# Patient Record
Sex: Male | Born: 1960 | Race: White | Hispanic: No | Marital: Married | Smoking: Never smoker
Health system: Southern US, Community
[De-identification: ages and names within clinical notes are randomized; demographics above are authoritative.]

## PROBLEM LIST (undated history)

## (undated) DIAGNOSIS — Z8249 Family history of ischemic heart disease and other diseases of the circulatory system: Secondary | ICD-10-CM

## (undated) DIAGNOSIS — I2510 Atherosclerotic heart disease of native coronary artery without angina pectoris: Secondary | ICD-10-CM

## (undated) DIAGNOSIS — R001 Bradycardia, unspecified: Secondary | ICD-10-CM

## (undated) DIAGNOSIS — E785 Hyperlipidemia, unspecified: Secondary | ICD-10-CM

## (undated) DIAGNOSIS — I10 Essential (primary) hypertension: Secondary | ICD-10-CM

## (undated) HISTORY — DX: Essential (primary) hypertension: I10

## (undated) HISTORY — DX: Family history of ischemic heart disease and other diseases of the circulatory system: Z82.49

## (undated) HISTORY — DX: Atherosclerotic heart disease of native coronary artery without angina pectoris: I25.10

---

## 2014-05-06 ENCOUNTER — Emergency Department (HOSPITAL_COMMUNITY)
Admission: EM | Admit: 2014-05-06 | Discharge: 2014-05-06 | Disposition: A | Discharge status: Home / Self Care | Payer: BC Managed Care – PPO | Attending: Emergency Medicine | Admitting: Emergency Medicine

## 2014-05-06 ENCOUNTER — Emergency Department (HOSPITAL_COMMUNITY): Payer: BC Managed Care – PPO

## 2014-05-06 ENCOUNTER — Encounter (HOSPITAL_COMMUNITY): Payer: Self-pay | Admitting: Emergency Medicine

## 2014-05-06 DIAGNOSIS — I10 Essential (primary) hypertension: Secondary | ICD-10-CM | POA: Insufficient documentation

## 2014-05-06 DIAGNOSIS — R0789 Other chest pain: Secondary | ICD-10-CM | POA: Insufficient documentation

## 2014-05-06 DIAGNOSIS — M25519 Pain in unspecified shoulder: Secondary | ICD-10-CM | POA: Insufficient documentation

## 2014-05-06 DIAGNOSIS — R51 Headache: Secondary | ICD-10-CM | POA: Insufficient documentation

## 2014-05-06 LAB — CBC
HCT: 44.2 % (ref 39.0–52.0)
HEMOGLOBIN: 15.6 g/dL (ref 13.0–17.0)
MCH: 30.2 pg (ref 26.0–34.0)
MCHC: 35.3 g/dL (ref 30.0–36.0)
MCV: 85.7 fL (ref 78.0–100.0)
Platelets: 217 10*3/uL (ref 150–400)
RBC: 5.16 MIL/uL (ref 4.22–5.81)
RDW: 12.9 % (ref 11.5–15.5)
WBC: 9.8 10*3/uL (ref 4.0–10.5)

## 2014-05-06 LAB — URINALYSIS, ROUTINE W REFLEX MICROSCOPIC
BILIRUBIN URINE: NEGATIVE
Glucose, UA: NEGATIVE mg/dL
HGB URINE DIPSTICK: NEGATIVE
Ketones, ur: NEGATIVE mg/dL
Leukocytes, UA: NEGATIVE
Nitrite: NEGATIVE
Protein, ur: NEGATIVE mg/dL
SPECIFIC GRAVITY, URINE: 1.007 (ref 1.005–1.030)
UROBILINOGEN UA: 0.2 mg/dL (ref 0.0–1.0)
pH: 7.5 (ref 5.0–8.0)

## 2014-05-06 LAB — COMPREHENSIVE METABOLIC PANEL
ALK PHOS: 84 U/L (ref 39–117)
ALT: 24 U/L (ref 0–53)
AST: 19 U/L (ref 0–37)
Albumin: 3.9 g/dL (ref 3.5–5.2)
BILIRUBIN TOTAL: 0.5 mg/dL (ref 0.3–1.2)
BUN: 16 mg/dL (ref 6–23)
CHLORIDE: 102 meq/L (ref 96–112)
CO2: 23 mEq/L (ref 19–32)
Calcium: 9.3 mg/dL (ref 8.4–10.5)
Creatinine, Ser: 0.89 mg/dL (ref 0.50–1.35)
GFR calc non Af Amer: >90 mL/min (ref >90)
GLUCOSE: 100 mg/dL — AB (ref 70–99)
Potassium: 3.8 mEq/L (ref 3.7–5.3)
Sodium: 139 mEq/L (ref 137–147)
TOTAL PROTEIN: 7.2 g/dL (ref 6.0–8.3)

## 2014-05-06 LAB — I-STAT TROPONIN, ED
TROPONIN I, POC: 0.07 ng/mL (ref 0.00–0.08)
Troponin i, poc: 0.07 ng/mL (ref 0.00–0.08)

## 2014-05-06 MED ORDER — HYDROCHLOROTHIAZIDE 25 MG PO TABS: 25.0000 mg | ORAL_TABLET | Freq: Every day | ORAL | Status: DC

## 2014-05-06 MED ORDER — ASPIRIN 81 MG PO CHEW: 324.0000 mg | CHEWABLE_TABLET | Freq: Once | ORAL | Status: DC

## 2014-05-06 MED ORDER — LORAZEPAM 2 MG/ML IJ SOLN
1.0000 mg | Freq: Once | INTRAMUSCULAR | Status: AC
  Administered 2014-05-06: 1 mg via INTRAVENOUS
  Filled 2014-05-06: qty 1

## 2014-05-06 MED ORDER — NITROGLYCERIN 0.4 MG SL SUBL: 0.4000 mg | SUBLINGUAL_TABLET | SUBLINGUAL | Status: DC | PRN

## 2014-05-06 NOTE — ED Notes (Signed)
PA at bedside.

## 2014-05-06 NOTE — ED Notes (Signed)
Father and Mother both had MI.  Pt does have heartburn frequently.

## 2014-05-06 NOTE — ED Provider Notes (Signed)
CSN: [id number]     Arrival date & time 05/06/14  1045 History   First MD Initiated Contact with Patient 05/06/14 1046     Chief Complaint  Patient presents with  . Chest Pain     (Consider location/radiation/quality/duration/timing/severity/associated sxs/prior Treatment) The history is provided by the patient and a relative. No language interpreter was used.   Tyler Holder is a(n) 53 y.o. male who presents emergency Department with chief complaint of chest pain. He was brought in via EMS from outside urgent care. He has no significant past medical history. The patient states that for the past 4 days he has had intermittent left sided chest pain described as sharp and radiating to the left shoulder. He describes his left shoulder pain and aching. He states that it comes on at any time whether he is exerting himself resting. He states but he has no associated shortness of breath, diaphoresis, nausea, vomiting. He denies any lifting or tearing pain. He denies any radiation to his back. The patient last episode occurred this morning at 8 AM. His wife made him come to evaluation. He does not see doctors regularly and was found to have a hypertensive 190/120. He denies a history of high cholesterol, smoking, or diabetes. He denies a known history of hypertension. His mother and father both had heart attacks in their 10760s. Patient's wife states that he is under extreme stress. 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 Patient also complains of headache which begins in the left occiput and radiates to his left eye. His wife states is relieved with massage. Denies photophobia, phonophobia, UL throbbing, N/V, visual changes, stiff neck, neck pain, rash, or "thunderclap" onset.  Denies fevers, chills, myalgias, arthralgias. Denies DOE, SOB, chest tightness or pressure. Denies dysuria, flank pain, suprapubic pain, frequency, urgency, or hematuria.Denies abdominal pain,  nausea, vomiting, diarrhea or constipation.    History reviewed. No pertinent past medical history. History reviewed. No pertinent past surgical history. No family history on file. History  Substance Use Topics  . Smoking status: Never Smoker   . Smokeless tobacco: Not on file  . Alcohol Use: No    Review of Systems  Ten systems reviewed and are negative for acute change, except as noted in the HPI. \   Allergies  Review of patient's allergies indicates no known allergies.  Home Medications   Prior to Admission medications   Not on File   BP 143/100  Pulse 83  Temp(Src) 98.2 F (36.8 C) (Oral)  Resp 20  Ht 5\' 10"  (1.778 m)  Wt 193 lb (87.544 kg)  BMI 27.69 kg/m2  SpO2 100% Physical Exam  Nursing note and vitals reviewed. Constitutional: He is oriented to person, place, and time. He appears well-developed and well-nourished. No distress.  HENT:  Head: Normocephalic and atraumatic.  Eyes: Conjunctivae are normal. No scleral icterus.  Neck: Normal range of motion. Neck supple.  Cardiovascular: Normal rate, regular rhythm and normal heart sounds.   Pulmonary/Chest: Effort normal and breath sounds normal. No respiratory distress.  Abdominal: Soft. There is no tenderness.  Musculoskeletal: Normal range of motion. He exhibits no edema.  Neurological: He is alert and oriented to person, place, and time.  Skin: Skin is warm and dry. He is not diaphoretic.  Psychiatric: His behavior is normal.  Very anxious     ED Course  Procedures (including critical care time) Labs Review Labs Reviewed  CBC  URINALYSIS,  ROUTINE W REFLEX MICROSCOPIC  COMPREHENSIVE METABOLIC PANEL  I-STAT TROPOININ, ED    Imaging Review No results found.   EKG Interpretation   Date/Time:  Tuesday May 06 2014 10:52:11 EDT Ventricular Rate:  70 PR Interval:  143 QRS Duration: 96 QT Interval:  399 QTC Calculation: 430 R Axis:   -29 Text Interpretation:  Sinus rhythm Left axis deviation  Non-specific ST-t  changes Confirmed by Juleen ChinaKOHUT  MD, STEPHEN ([id number]) on 05/06/2014 11:34:31 AM      MDM   Final diagnoses:  None                       3:26 PM Filed Vitals:   05/06/14 1215 05/06/14 1346 05/06/14 1455 05/06/14 1457  BP: 161/94 152/95 150/76 154/100  Pulse: 80 70    Temp:      TempSrc:      Resp: 14 18    Height:      Weight:      SpO2: 97% 96%      Vital Signs - BP: 150/76 mmHg ; BP Location: Right arm ; BP Method: Automatic ; Patient Position (if appropriate): Sitting 14:57:41 Vital Signs DH Vital Signs - BP: 154/100 mmHg ; BP Location: Left arm ; BP Method: Automatic ; Patient Position (if appropriate): Sitting  3:38 PM BP 154/100  Pulse 70  Temp(Src) 98.2 F (36.8 C) (Oral)  Resp 18  Ht 5\' 10"  (1.778 m)  Wt 193 lb (87.544 kg)  BMI 27.69 kg/m2  SpO2 96% Patient with htn, CP Free at this time  Equal pulses and pressures BL. No repeat episodes of cp here in the ED.[name]. Discussed the need for PCP follow up for the patient's HTN. Patient is to be discharged with recommendation to follow up with PCP in regards to today's hospital visit. Chest pain is not likely of cardiac or pulmonary etiology d/t presentation, perc negative, VSS, no tracheal deviation, no JVD or new murmur, RRR, breath sounds equal bilaterally, EKG without acute abnormalities, negative troponin, and negative CXR. Pt has been advised to return to the ED is CP becomes exertional, associated with diaphoresis or nausea, radiates to left jaw/arm, worsens or becomes concerning in any way. Pt appears reliable for follow up and is agreeable to discharge.   Case has been discussed with and seen by Dr. Juleen ChinaKohut who agrees with the above plan to discharge.       Arthor CaptainAbigail Armanda Forand, PA-C 05/06/14 2213

## 2014-05-06 NOTE — Discharge Instructions (Signed)
Your caregiver has diagnosed you as having chest pain that is not specific for one problem, but does not require admission.  You are at low risk for an acute heart condition or other serious illness. Chest pain comes from many different causes.  SEEK IMMEDIATE MEDICAL ATTENTION IF: You have severe chest pain, especially if the pain is crushing or pressure-like and spreads to the arms, back, neck, or jaw, or if you have sweating, nausea (feeling sick to your stomach), or shortness of breath. THIS IS AN EMERGENCY. Don't wait to see if the pain will go away. Get medical help at once. Call 911 or 0 (operator). DO NOT drive yourself to the hospital.  Your chest pain gets worse and does not go away with rest.  You have an attack of chest pain lasting longer than usual, despite rest and treatment with the medications your caregiver has prescribed.  You wake from sleep with chest pain or shortness of breath.  You feel dizzy or faint.  You have chest pain not typical of your usual pain for which you originally saw your caregiver.   Chest Pain (Nonspecific) It is often hard to give a specific diagnosis for the cause of chest pain. There is always a chance that your pain could be related to something serious, such as a heart attack or a blood clot in the lungs. You need to follow up with your caregiver for further evaluation. CAUSES   Heartburn.  Pneumonia or bronchitis.  Anxiety or stress.  Inflammation around your heart (pericarditis) or lung (pleuritis or pleurisy).  A blood clot in the lung.  A collapsed lung (pneumothorax). It can develop suddenly on its own (spontaneous pneumothorax) or from injury (trauma) to the chest.  Shingles infection (herpes zoster virus). The chest wall is composed of bones, muscles, and cartilage. Any of these can be the source of the pain.  The bones can be bruised by injury.  The muscles or cartilage can be strained by coughing or overwork.  The cartilage can  be affected by inflammation and become sore (costochondritis). DIAGNOSIS  Lab tests or other studies, such as X-rays, electrocardiography, stress testing, or cardiac imaging, may be needed to find the cause of your pain.  TREATMENT   Treatment depends on what may be causing your chest pain. Treatment may include:  Acid blockers for heartburn.  Anti-inflammatory medicine.  Pain medicine for inflammatory conditions.  Antibiotics if an infection is present.  You may be advised to change lifestyle habits. This includes stopping smoking and avoiding alcohol, caffeine, and chocolate.  You may be advised to keep your head raised (elevated) when sleeping. This reduces the chance of acid going backward from your stomach into your esophagus.  Most of the time, nonspecific chest pain will improve within 2 to 3 days with rest and mild pain medicine. HOME CARE INSTRUCTIONS   If antibiotics were prescribed, take your antibiotics as directed. Finish them even if you start to feel better.  For the next few days, avoid physical activities that bring on chest pain. Continue physical activities as directed.  Do not smoke.  Avoid drinking alcohol.  Only take over-the-counter or prescription medicine for pain, discomfort, or fever as directed by your caregiver.  Follow your caregiver's suggestions for further testing if your chest pain does not go away.  Keep any follow-up appointments you made. If you do not go to an appointment, you could develop lasting (chronic) problems with pain. If there is any problem keeping an  appointment, you must call to reschedule. SEEK MEDICAL CARE IF:   You think you are having problems from the medicine you are taking. Read your medicine instructions carefully.  Your chest pain does not go away, even after treatment.  You develop a rash with blisters on your chest. SEEK IMMEDIATE MEDICAL CARE IF:   You have increased chest pain or pain that spreads to your arm,  neck, jaw, back, or abdomen.  You develop shortness of breath, an increasing cough, or you are coughing up blood.  You have severe back or abdominal pain, feel nauseous, or vomit.  You develop severe weakness, fainting, or chills.  You have a fever. THIS IS AN EMERGENCY. Do not wait to see if the pain will go away. Get medical help at once. Call your local emergency services (911 in [place] Do not drive yourself to the hospital. MAKE SURE YOU:   Understand these instructions.  Will watch your condition.  Will get help right away if you are not doing well or get worse. Document Released: 09/14/2005 Document Revised: 02/27/2012 Document Reviewed: 07/10/2008 [facility] Patient Information 2014 [place], [place].  Hypertension As your heart beats, it forces blood through your arteries. This force is your blood pressure. If the pressure is too high, it is called hypertension (HTN) or high blood pressure. HTN is dangerous because you may have it and not know it. High blood pressure may mean that your heart has to work harder to pump blood. Your arteries may be narrow or stiff. The extra work puts you at risk for heart disease, stroke, and other problems.  Blood pressure consists of two numbers, a higher number over a lower, 110/72, for example. It is stated as "110 over 72." The ideal is below 120 for the top number (systolic) and under 80 for the bottom (diastolic). Write down your blood pressure today. You should pay close attention to your blood pressure if you have certain conditions such as:  Heart failure.  Prior heart attack.  Diabetes  Chronic kidney disease.  Prior stroke.  Multiple risk factors for heart disease. To see if you have HTN, your blood pressure should be measured while you are seated with your arm held at the level of the heart. It should be measured at least twice. A one-time elevated blood pressure reading (especially in the Emergency Department) does not mean that  you need treatment. There may be conditions in which the blood pressure is different between your right and left arms. It is important to see your caregiver soon for a recheck. Most people have essential hypertension which means that there is not a specific cause. This type of high blood pressure may be lowered by changing lifestyle factors such as:  Stress.  Smoking.  Lack of exercise.  Excessive weight.  Drug/tobacco/alcohol use.  Eating less salt. Most people do not have symptoms from high blood pressure until it has caused damage to the body. Effective treatment can often prevent, delay or reduce that damage. TREATMENT  When a cause has been identified, treatment for high blood pressure is directed at the cause. There are a large number of medications to treat HTN. These fall into several categories, and your caregiver will help you select the medicines that are best for you. Medications may have side effects. You should review side effects with your caregiver. If your blood pressure stays high after you have made lifestyle changes or started on medicines,   Your medication(s) may need to be changed.  Other problems may need to be addressed.  Be certain you understand your prescriptions, and know how and when to take your medicine.  Be sure to follow up with your caregiver within the time frame advised (usually within two weeks) to have your blood pressure rechecked and to review your medications.  If you are taking more than one medicine to lower your blood pressure, make sure you know how and at what times they should be taken. Taking two medicines at the same time can result in blood pressure that is too low. SEEK IMMEDIATE MEDICAL CARE IF:  You develop a severe headache, blurred or changing vision, or confusion.  You have unusual weakness or numbness, or a faint feeling.  You have severe chest or abdominal pain, vomiting, or breathing problems. MAKE SURE YOU:   Understand  these instructions.  Will watch your condition.  Will get help right away if you are not doing well or get worse. Document Released: 12/05/2005 Document Revised: 02/27/2012 Document Reviewed: 07/25/2008 [facility] Patient Information 2014 [place].    Hydrochlorothiazide, HCTZ capsules or tablets What is this medicine? HYDROCHLOROTHIAZIDE (hye droe klor oh THYE a zide) is a diuretic. It increases the amount of urine passed, which causes the body to lose salt and water. This medicine is used to treat high blood pressure. It is also reduces the swelling and water retention caused by various medical conditions, such as heart, liver, or kidney disease. This medicine may be used for other purposes; ask your health care provider or pharmacist if you have questions. COMMON BRAND NAME(S): Esidrix, Ezide, HydroDIURIL, Microzide, Oretic, Zide What should I tell my health care provider before I take this medicine? They need to know if you have any of these conditions: -diabetes -gout -immune system problems, like lupus -kidney disease or kidney stones -liver disease -pancreatitis -small amount of urine or difficulty passing urine -an unusual or allergic reaction to hydrochlorothiazide, sulfa drugs, other medicines, foods, dyes, or preservatives -pregnant or trying to get pregnant -breast-feeding How should I use this medicine? Take this medicine by mouth with a glass of water. Follow the directions on the prescription label. Take your medicine at regular intervals. Remember that you will need to pass urine frequently after taking this medicine. Do not take your doses at a time of day that will cause you problems. Do not stop taking your medicine unless your doctor tells you to. Talk to your pediatrician regarding the use of this medicine in children. Special care may be needed. Overdosage: If you think you have taken too much of this medicine contact a poison control center or emergency room  at once. NOTE: This medicine is only for you. Do not share this medicine with others. What if I miss a dose? If you miss a dose, take it as soon as you can. If it is almost time for your next dose, take only that dose. Do not take double or extra doses. What may interact with this medicine? -cholestyramine -colestipol -digoxin -dofetilide -lithium -medicines for blood pressure -medicines for diabetes -medicines that relax muscles for surgery -other diuretics -steroid medicines like prednisone or cortisone This list may not describe all possible interactions. Give your health care provider a list of all the medicines, herbs, non-prescription drugs, or dietary supplements you use. Also tell them if you smoke, drink alcohol, or use illegal drugs. Some items may interact with your medicine. What should I watch for while using this medicine? Visit your doctor or health care professional  for regular checks on your progress. Check your blood pressure as directed. Ask your doctor or health care professional what your blood pressure should be and when you should contact him or her. You may need to be on a special diet while taking this medicine. Ask your doctor. Check with your doctor or health care professional if you get an attack of severe diarrhea, nausea and vomiting, or if you sweat a lot. The loss of too much body fluid can make it dangerous for you to take this medicine. You may get drowsy or dizzy. Do not drive, use machinery, or do anything that needs mental alertness until you know how this medicine affects you. Do not stand or sit up quickly, especially if you are an older patient. This reduces the risk of dizzy or fainting spells. Alcohol may interfere with the effect of this medicine. Avoid alcoholic drinks. This medicine may affect your blood sugar level. If you have diabetes, check with your doctor or health care professional before changing the dose of your diabetic medicine. This  medicine can make you more sensitive to the sun. Keep out of the sun. If you cannot avoid being in the sun, wear protective clothing and use sunscreen. Do not use sun lamps or tanning beds/booths. What side effects may I notice from receiving this medicine? Side effects that you should report to your doctor or health care professional as soon as possible: -allergic reactions such as skin rash or itching, hives, swelling of the lips, mouth, tongue, or throat -changes in vision -chest pain -eye pain -fast or irregular heartbeat -feeling faint or lightheaded, falls -gout attack -muscle pain or cramps -pain or difficulty when passing urine -pain, tingling, numbness in the hands or feet -redness, blistering, peeling or loosening of the skin, including inside the mouth -unusually weak or tired Side effects that usually do not require medical attention (report to your doctor or health care professional if they continue or are bothersome): -change in sex drive or performance -dry mouth -headache -stomach upset This list may not describe all possible side effects. Call your doctor for medical advice about side effects. You may report side effects to FDA at 1-800-FDA-1088. Where should I keep my medicine? Keep out of the reach of children. Store at room temperature between 15 and 30 degrees C (59 and 86 degrees F). Do not freeze. Protect from light and moisture. Keep container closed tightly. Throw away any unused medicine after the expiration date. NOTE: This sheet is a summary. It may not cover all possible information. If you have questions about this medicine, talk to your doctor, pharmacist, or health care provider.  2014, [employer]vier/[employer] Standard. (2010-07-30 12:57:37)

## 2014-05-06 NOTE — ED Notes (Signed)
PT arrived via [facility] from MD office.  Pt is here with chest tightness intermittent that started on Thursday.  It lasts only about one minute and is 10/10 when comes.  Pt reports mid sternal chest pain, left eye headache and neck pain.  Bp 190/120 to 174/103.  Pt has had 325mg  asa at MD office, no nitro.  Pt is currently pain free

## 2014-05-10 NOTE — ED Provider Notes (Signed)
Medical screening examination/treatment/procedure(s) were performed by non-physician practitioner and as supervising physician I was immediately available for consultation/collaboration.   EKG Interpretation   Date/Time:  Tuesday May 06 2014 10:52:11 EDT Ventricular Rate:  70 PR Interval:  143 QRS Duration: 96 QT Interval:  399 QTC Calculation: 430 R Axis:   -29 Text Interpretation:  Sinus rhythm Left axis deviation Non-specific ST-t  changes Confirmed by Juleen China  MD, Nate Common ([id number]) on 05/06/2014 11:34:31 AM       Raeford Razor, MD 05/10/14 1428

## 2014-05-29 ENCOUNTER — Ambulatory Visit (INDEPENDENT_AMBULATORY_CARE_PROVIDER_SITE_OTHER): Payer: BC Managed Care – PPO | Admitting: Cardiovascular Disease

## 2014-05-29 ENCOUNTER — Encounter: Payer: Self-pay | Admitting: Cardiovascular Disease

## 2014-05-29 VITALS — BP 131/87 | HR 63 | Ht 70.0 in | Wt 195.0 lb

## 2014-05-29 DIAGNOSIS — Z8249 Family history of ischemic heart disease and other diseases of the circulatory system: Secondary | ICD-10-CM

## 2014-05-29 DIAGNOSIS — R079 Chest pain, unspecified: Secondary | ICD-10-CM

## 2014-05-29 DIAGNOSIS — I10 Essential (primary) hypertension: Secondary | ICD-10-CM | POA: Insufficient documentation

## 2014-05-29 DIAGNOSIS — Z79899 Other long term (current) drug therapy: Secondary | ICD-10-CM

## 2014-05-29 MED ORDER — PANTOPRAZOLE SODIUM 40 MG PO TBEC: 40.0000 mg | DELAYED_RELEASE_TABLET | Freq: Every day | ORAL | Status: DC

## 2014-05-29 MED ORDER — AMLODIPINE BESYLATE 5 MG PO TABS: 5.0000 mg | ORAL_TABLET | Freq: Every day | ORAL | Status: DC

## 2014-05-29 NOTE — Assessment & Plan Note (Signed)
History chest pain dating back 3 weeks. It waxes and wanes, raised his left upper extremity as well as his throat. It has awakened from sleep. His only risk factors are hypertension and family history. I'm going to add amlodipine to his current medical regimen and obtain an exercise Myoview stress test. A total EKG performed several weeks ago at an urgent care was normal

## 2014-05-29 NOTE — Progress Notes (Signed)
     05/29/2014 Tyler Holder   September 24, 1961  [id number]  Primary Physician Tyler Rubenstein, MD Primary Cardiologist: Tyler Gess MD Tyler Holder   HPI:  Tyler Holder is a 53 year old appearing married Caucasian male father of 2 children, grandfather and one grandchild who does HVAC for a living. He is referred by his primary care physician for evaluation of chest pain. This pain is new onset and began 3 weeks ago. It waxes and wanes. It radiates to his throat and left upper extremity. His receptors include hypertension and family history.   Current Outpatient Prescriptions  Medication Sig Dispense Refill  . ALPRAZolam (XANAX) 0.25 MG tablet Take 0.25 mg by mouth as needed.       [name] aspirin 81 MG tablet Take 81 mg by mouth daily.      [name] ibuprofen (ADVIL,MOTRIN) 200 MG tablet Take 600 mg by mouth every morning.      [name] lisinopril (PRINIVIL,ZESTRIL) 20 MG tablet Take 20 mg by mouth daily.       [name] amLODipine (NORVASC) 5 MG tablet Take 1 tablet (5 mg total) by mouth daily.  180 tablet  3  . pantoprazole (PROTONIX) 40 MG tablet Take 1 tablet (40 mg total) by mouth daily.  30 tablet  11   No current facility-administered medications for this visit.    No Known Allergies  History   Social History  . Marital Status: Married    Spouse Name: N/A    Number of Children: N/A  . Years of Education: N/A   Occupational History  . Not on file.   Social History Main Topics  . Smoking status: Never Smoker   . Smokeless tobacco: Not on file  . Alcohol Use: No  . Drug Use: No  . Sexual Activity: Not on file   Other Topics Concern  . Not on file   Social History Narrative  . No narrative on file     Review of Systems: General: negative for chills, fever, night sweats or weight changes.  Cardiovascular: negative for chest pain, dyspnea on exertion, edema, orthopnea, palpitations, paroxysmal nocturnal dyspnea or shortness of breath Dermatological: negative for rash Respiratory: negative  for cough or wheezing Urologic: negative for hematuria Abdominal: negative for nausea, vomiting, diarrhea, bright red blood per rectum, melena, or hematemesis Neurologic: negative for visual changes, syncope, or dizziness All other systems reviewed and are otherwise negative except as noted above.    Blood pressure 131/87, pulse 63, height 5\' 10"  (1.778 m), weight 195 lb (88.451 kg).  General appearance: alert and no distress Neck: no adenopathy, no carotid bruit, no JVD, supple, symmetrical, trachea midline and thyroid not enlarged, symmetric, no tenderness/mass/nodules Lungs: clear to auscultation bilaterally Heart: regular rate and rhythm, S1, S2 normal, no murmur, click, rub or gallop Extremities: extremities normal, atraumatic, no cyanosis or edema  EKG not performed today  ASSESSMENT AND PLAN:   Essential hypertension On lisinopril, will add low-dose amlodipine for hypertensive effect as well as anti-anginal  Chest pain History chest pain dating back 3 weeks. It waxes and wanes, raised his left upper extremity as well as his throat. It has awakened from sleep. His only risk factors are hypertension and family history. I'm going to add amlodipine to his current medical regimen and obtain an exercise Myoview stress test. A total EKG performed several weeks ago at an urgent care was normal      Tyler Gess MD [facility], [facility] 05/29/2014 6:09 PM

## 2014-05-29 NOTE — Assessment & Plan Note (Signed)
On lisinopril, will add low-dose amlodipine for hypertensive effect as well as anti-anginal

## 2014-05-29 NOTE — Patient Instructions (Signed)
  We will see you back in follow up after the tests.  Dr Allyson Sabal has ordered : 1. Exercise Myoview- this is a test that looks at the blood flow to your heart muscle.  It takes approximately 2 1/2 hours. Please follow instruction sheet, as given.  2. Start Protonix 40mg  daily  3. Start Amlodipine 5mg  daily  4. Your physician recommends that you return for a FASTING lipid profile: see attached order form

## 2014-06-03 ENCOUNTER — Telehealth (HOSPITAL_COMMUNITY): Payer: Self-pay

## 2014-06-05 ENCOUNTER — Ambulatory Visit (INDEPENDENT_AMBULATORY_CARE_PROVIDER_SITE_OTHER): Payer: BC Managed Care – PPO | Admitting: Cardiology

## 2014-06-05 ENCOUNTER — Encounter (HOSPITAL_COMMUNITY): Payer: Self-pay | Admitting: *Deleted

## 2014-06-05 ENCOUNTER — Ambulatory Visit (HOSPITAL_BASED_OUTPATIENT_CLINIC_OR_DEPARTMENT_OTHER)
Admission: RE | Admit: 2014-06-05 | Discharge: 2014-06-05 | Disposition: A | Discharge status: Home / Self Care | Payer: BC Managed Care – PPO | Source: Ambulatory Visit | Attending: Cardiovascular Disease | Admitting: Cardiovascular Disease

## 2014-06-05 ENCOUNTER — Other Ambulatory Visit (HOSPITAL_COMMUNITY): Payer: Self-pay | Admitting: *Deleted

## 2014-06-05 ENCOUNTER — Encounter: Payer: Self-pay | Admitting: Cardiology

## 2014-06-05 ENCOUNTER — Inpatient Hospital Stay (HOSPITAL_COMMUNITY)
Admission: AD | Admit: 2014-06-05 | Discharge: 2014-06-07 | DRG: 247 | Disposition: A | Discharge status: Home / Self Care | Payer: BC Managed Care – PPO | Source: Ambulatory Visit | Attending: Cardiovascular Disease | Admitting: Cardiovascular Disease

## 2014-06-05 VITALS — BP 166/98 | HR 75 | Ht 70.0 in | Wt 195.0 lb

## 2014-06-05 DIAGNOSIS — Z7982 Long term (current) use of aspirin: Secondary | ICD-10-CM

## 2014-06-05 DIAGNOSIS — R079 Chest pain, unspecified: Secondary | ICD-10-CM | POA: Diagnosis not present

## 2014-06-05 DIAGNOSIS — E785 Hyperlipidemia, unspecified: Secondary | ICD-10-CM | POA: Diagnosis present

## 2014-06-05 DIAGNOSIS — R9430 Abnormal result of cardiovascular function study, unspecified: Secondary | ICD-10-CM

## 2014-06-05 DIAGNOSIS — Z8249 Family history of ischemic heart disease and other diseases of the circulatory system: Secondary | ICD-10-CM

## 2014-06-05 DIAGNOSIS — I2582 Chronic total occlusion of coronary artery: Secondary | ICD-10-CM | POA: Diagnosis present

## 2014-06-05 DIAGNOSIS — I200 Unstable angina: Secondary | ICD-10-CM | POA: Diagnosis present

## 2014-06-05 DIAGNOSIS — I2510 Atherosclerotic heart disease of native coronary artery without angina pectoris: Secondary | ICD-10-CM | POA: Diagnosis present

## 2014-06-05 DIAGNOSIS — I10 Essential (primary) hypertension: Secondary | ICD-10-CM | POA: Diagnosis present

## 2014-06-05 DIAGNOSIS — I209 Angina pectoris, unspecified: Secondary | ICD-10-CM

## 2014-06-05 DIAGNOSIS — R9439 Abnormal result of other cardiovascular function study: Secondary | ICD-10-CM | POA: Diagnosis present

## 2014-06-05 HISTORY — DX: Hyperlipidemia, unspecified: E78.5

## 2014-06-05 LAB — CBC WITH DIFFERENTIAL/PLATELET
Basophils Absolute: 0 10*3/uL (ref 0.0–0.1)
Basophils Relative: 0 % (ref 0–1)
Eosinophils Absolute: 0.2 10*3/uL (ref 0.0–0.7)
Eosinophils Relative: 2 % (ref 0–5)
HCT: 45 % (ref 39.0–52.0)
Hemoglobin: 15.2 g/dL (ref 13.0–17.0)
Lymphocytes Relative: 27 % (ref 12–46)
Lymphs Abs: 2.7 10*3/uL (ref 0.7–4.0)
MCH: 29.3 pg (ref 26.0–34.0)
MCHC: 33.8 g/dL (ref 30.0–36.0)
MCV: 86.9 fL (ref 78.0–100.0)
Monocytes Absolute: 0.8 10*3/uL (ref 0.1–1.0)
Monocytes Relative: 8 % (ref 3–12)
Neutro Abs: 6.2 10*3/uL (ref 1.7–7.7)
Neutrophils Relative %: 63 % (ref 43–77)
Platelets: 222 10*3/uL (ref 150–400)
RBC: 5.18 MIL/uL (ref 4.22–5.81)
RDW: 12.8 % (ref 11.5–15.5)
WBC: 9.8 10*3/uL (ref 4.0–10.5)

## 2014-06-05 LAB — APTT: aPTT: 32 seconds (ref 24–37)

## 2014-06-05 LAB — MRSA PCR SCREENING: MRSA BY PCR: NEGATIVE

## 2014-06-05 LAB — PROTIME-INR
INR: 0.92 (ref 0.00–1.49)
Prothrombin Time: 12.2 seconds (ref 11.6–15.2)

## 2014-06-05 LAB — COMPREHENSIVE METABOLIC PANEL
ALT: 27 U/L (ref 0–53)
AST: 18 U/L (ref 0–37)
Albumin: 4.1 g/dL (ref 3.5–5.2)
Alkaline Phosphatase: 74 U/L (ref 39–117)
BUN: 20 mg/dL (ref 6–23)
CO2: 25 mEq/L (ref 19–32)
Calcium: 9.6 mg/dL (ref 8.4–10.5)
Chloride: 101 mEq/L (ref 96–112)
Creatinine, Ser: 1.03 mg/dL (ref 0.50–1.35)
GFR calc Af Amer: >90 mL/min (ref >90)
GFR calc non Af Amer: 81 mL/min — ABNORMAL LOW (ref >90)
Glucose, Bld: 94 mg/dL (ref 70–99)
Potassium: 4.1 mEq/L (ref 3.7–5.3)
Sodium: 140 mEq/L (ref 137–147)
Total Bilirubin: 0.4 mg/dL (ref 0.3–1.2)
Total Protein: 7.3 g/dL (ref 6.0–8.3)

## 2014-06-05 LAB — TROPONIN I: Troponin I: <0.3 ng/mL (ref <0.30)

## 2014-06-05 LAB — TSH: TSH: 2.99 u[IU]/mL (ref 0.350–4.500)

## 2014-06-05 MED ORDER — HEPARIN (PORCINE) IN NACL 100-0.45 UNIT/ML-% IJ SOLN
INTRAMUSCULAR | Status: AC
  Administered 2014-06-05: 1250 [IU]/h via INTRAVENOUS
  Filled 2014-06-05: qty 250

## 2014-06-05 MED ORDER — ASPIRIN 81 MG PO CHEW
324.0000 mg | CHEWABLE_TABLET | ORAL | Status: AC
  Administered 2014-06-05: 324 mg via ORAL

## 2014-06-05 MED ORDER — ASPIRIN 81 MG PO CHEW
81.0000 mg | CHEWABLE_TABLET | ORAL | Status: AC
  Administered 2014-06-06: 81 mg via ORAL
  Filled 2014-06-05: qty 1

## 2014-06-05 MED ORDER — ATORVASTATIN CALCIUM 20 MG PO TABS
20.0000 mg | ORAL_TABLET | Freq: Every day | ORAL | Status: DC
  Administered 2014-06-05: 20 mg via ORAL
  Filled 2014-06-05 (×2): qty 1

## 2014-06-05 MED ORDER — HEPARIN BOLUS VIA INFUSION
4000.0000 [IU] | Freq: Once | INTRAVENOUS | Status: AC
  Administered 2014-06-05: 4000 [IU] via INTRAVENOUS
  Filled 2014-06-05: qty 4000

## 2014-06-05 MED ORDER — ASPIRIN EC 81 MG PO TBEC
81.0000 mg | DELAYED_RELEASE_TABLET | Freq: Every day | ORAL | Status: DC
  Administered 2014-06-06 – 2014-06-07 (×2): 81 mg via ORAL
  Filled 2014-06-05 (×2): qty 1

## 2014-06-05 MED ORDER — NITROGLYCERIN IN D5W 200-5 MCG/ML-% IV SOLN
3.0000 ug/min | INTRAVENOUS | Status: DC
  Administered 2014-06-05: 3 ug/min via INTRAVENOUS

## 2014-06-05 MED ORDER — AMLODIPINE BESYLATE 5 MG PO TABS
5.0000 mg | ORAL_TABLET | Freq: Every day | ORAL | Status: DC
  Administered 2014-06-06 – 2014-06-07 (×2): 5 mg via ORAL
  Filled 2014-06-05 (×2): qty 1

## 2014-06-05 MED ORDER — LISINOPRIL 20 MG PO TABS
20.0000 mg | ORAL_TABLET | Freq: Every day | ORAL | Status: DC
  Administered 2014-06-06 – 2014-06-07 (×2): 20 mg via ORAL
  Filled 2014-06-05 (×2): qty 1

## 2014-06-05 MED ORDER — TECHNETIUM TC 99M SESTAMIBI GENERIC - CARDIOLITE
30.0000 | Freq: Once | INTRAVENOUS | Status: AC | PRN
  Administered 2014-06-05: 30 via INTRAVENOUS

## 2014-06-05 MED ORDER — SODIUM CHLORIDE 0.9 % IV SOLN
INTRAVENOUS | Status: DC
  Administered 2014-06-05: 20:00:00 via INTRAVENOUS

## 2014-06-05 MED ORDER — ZOLPIDEM TARTRATE 5 MG PO TABS: 5.0000 mg | ORAL_TABLET | Freq: Every evening | ORAL | Status: DC | PRN

## 2014-06-05 MED ORDER — ONDANSETRON HCL 4 MG/2ML IJ SOLN
4.0000 mg | Freq: Four times a day (QID) | INTRAMUSCULAR | Status: DC | PRN
  Administered 2014-06-06: 4 mg via INTRAVENOUS

## 2014-06-05 MED ORDER — HEPARIN (PORCINE) IN NACL 100-0.45 UNIT/ML-% IJ SOLN
1250.0000 [IU]/h | INTRAMUSCULAR | Status: DC
  Administered 2014-06-05 – 2014-06-06 (×2): 1250 [IU]/h via INTRAVENOUS
  Filled 2014-06-05 (×2): qty 250

## 2014-06-05 MED ORDER — NITROGLYCERIN 0.4 MG SL SUBL: 0.4000 mg | SUBLINGUAL_TABLET | SUBLINGUAL | Status: DC | PRN

## 2014-06-05 MED ORDER — ACETAMINOPHEN 325 MG PO TABS
650.0000 mg | ORAL_TABLET | ORAL | Status: DC | PRN
  Administered 2014-06-05: 650 mg via ORAL
  Filled 2014-06-05: qty 2

## 2014-06-05 MED ORDER — NITROGLYCERIN IN D5W 200-5 MCG/ML-% IV SOLN
INTRAVENOUS | Status: AC
  Administered 2014-06-05: 3 ug/min via INTRAVENOUS
  Filled 2014-06-05: qty 250

## 2014-06-05 MED ORDER — PANTOPRAZOLE SODIUM 40 MG PO TBEC
40.0000 mg | DELAYED_RELEASE_TABLET | Freq: Every day | ORAL | Status: DC
  Administered 2014-06-06: 40 mg via ORAL
  Filled 2014-06-05: qty 1

## 2014-06-05 MED ORDER — ALPRAZOLAM 0.25 MG PO TABS: 0.2500 mg | ORAL_TABLET | Freq: Two times a day (BID) | ORAL | Status: DC | PRN

## 2014-06-05 MED ORDER — ASPIRIN 81 MG PO CHEW
CHEWABLE_TABLET | ORAL | Status: AC
  Filled 2014-06-05: qty 4

## 2014-06-05 MED ORDER — TECHNETIUM TC 99M SESTAMIBI GENERIC - CARDIOLITE
10.0000 | Freq: Once | INTRAVENOUS | Status: AC | PRN
Start: 2014-06-05 — End: 2014-06-05
  Administered 2014-06-05: 10 via INTRAVENOUS

## 2014-06-05 MED ORDER — ASPIRIN 300 MG RE SUPP
300.0000 mg | RECTAL | Status: AC
  Filled 2014-06-05: qty 1

## 2014-06-05 NOTE — Assessment & Plan Note (Signed)
Seen in the office today for Myoview which was abnormal with chest pain and EKG changes. Images suggest inferior ischemia.

## 2014-06-05 NOTE — Assessment & Plan Note (Signed)
F died of MI at 6063

## 2014-06-05 NOTE — Progress Notes (Addendum)
ANTICOAGULATION CONSULT NOTE - Initial Consult  Pharmacy Consult for Heparin Indication: chest pain/ACS  No Known Allergies  Patient Measurements: Height:  70 inches Weight:  195 pounds (88kg) Heparin dosing Weight:  78kg  Vital Signs: BP: 166/98 mmHg (06/18 1515) Pulse Rate: 75 (06/18 1515)  Medical History: Past Medical History  Diagnosis Date  . Hypertension   . Chest pain   . Family history of heart disease    Medications:  Infusions:  . nitroGLYCERIN     Assessment: 53yo male admitted from primary care MD for cardiac work up due to complaints of chest pain.  He is s/p nuc-med evaluation which was abnormal and we have been asked to dose IV heparin until he goes for cardiac cath.  Labs have been ordered but have not yet been collected.  A CBC one month ago, reveals a normal Hemoglobin and platelet function.  He has no noted bleeding complications currently and no past history for bleeding.  Goal of Therapy:  Heparin level 0.3-0.7 units/ml Monitor platelets by anticoagulation protocol: Yes   Plan:  Will begin IV heparin with a bolus of 4000 units and a drip at 1250 units/hr. Will check a 8 hour heparin level and adjust dose as needed F/u labs and call MD for anything urgent like severe thrombocytopenia to discuss ongoing heparin  Monitor for bleeding complications  Nadara MustardNita Johnston, PharmD., MS Clinical Pharmacist Pager:  [phone] Thank you for allowing pharmacy to be part of this patients care team. 06/05/2014,4:25 PM  Addendum: Baseline labs reviewed - H/H and platelets are WNL as is his baseline PT/INR - will f/u in AM

## 2014-06-05 NOTE — Procedures (Addendum)
[facility] [address] [place] [address] [phone]  Cardiology Nuclear Med Study  Tyler MouldRoger Holder is a 53 y.o. male     MRN : [id number]     DOB: 06-29-1961  Procedure Date: 06/05/2014  Nuclear Med Background Indication for Stress Test:  Evaluation for Ischemia, Post Hospital and Abnormal EKG History:  No prior cardiac or respiratory history reported;No prior NUC MPI for comparison. Cardiac Risk Factors: Family History - CAD and Hypertension  Symptoms:  Chest Pain, Fatigue and Palpitations   Nuclear Pre-Procedure Caffeine/Decaff Intake:  1:00am NPO After: 11am   IV Site: R Forearm  IV 0.9% NS with Angio Cath:  22g  Chest Size (in):  46"  IV Started by: Berdie OgrenAmanda Wease, RN  Height: 5\' 10"  (1.778 m)  Cup Size: n/a  BMI:  Body mass index is 27.98 kg/(m^2). Weight:  195 lb (88.451 kg)   Tech Comments:  n/a    Nuclear Med Study 1 or 2 day study: 1 day  Stress Test Type:  Stress  Order Authorizing Provider:  Nanetta BattyJonathan Berry, MD   Resting Radionuclide: Technetium 2565m Sestamibi  Resting Radionuclide Dose: 9.9 mCi   Stress Radionuclide:  Technetium 7665m Sestamibi  Stress Radionuclide Dose: 30.3 mCi           Stress Protocol Rest HR: 67 Stress HR: 137  Rest BP: 136/92 Stress BP: 165/99  Exercise Time (min): 6:52 METS: 8.30          Dose of Adenosine (mg):  n/a Dose of Lexiscan: n/a mg  Dose of Atropine (mg): n/a Dose of Dobutamine: n/a mcg/kg/min (at max HR)  Stress Test Technologist: Ernestene MentionGwen Farrington, CCT Nuclear Technologist: Koren Shiverobin Moffitt, CNMT   Rest Procedure:  Myocardial perfusion imaging was performed at rest 45 minutes following the intravenous administration of Technetium 5765m Sestamibi. Stress Procedure:  The patient performed treadmill exercise using a Bruce  Protocol for 6 minutes 52 seconds. The patient stopped due to chest pain.   There were significant ST-T wave changes.  Technetium 4765m Sestamibi was injected  at peak exercise and myocardial perfusion imaging was performed after a brief delay.  Transient Ischemic Dilatation (Normal <1.22):  1.15  QGS EDV:  134 ml QGS ESV:  73 ml LV Ejection Fraction: 45%      Rest ECG: NSR with T wave inversion in III, and V6  Stress ECG: Inferolateral T wave inversion during recovery associated with 10/10 chest burning  QPS Raw Data Images:  Normal; no motion artifact; normal heart/lung ratio. Stress Images:  Moderate in size and  intensity mid to basal lateral inferior defect Rest Images:  Small basal inferolateral defect Subtraction (SDS):  These findings are consistent with ischemia.  Impression Exercise Capacity:  Fair exercise capacity. BP Response:  Normal blood pressure response. Clinical Symptoms:  Patient developed significant chest burning 9-10/10 at peak stress and in recovery associated with ECG changes of ischemia and diaphoresis. Pain improved with sl NTG administration. ECG Impression:  Significant ST abnormalities consistent with ischemia. Comparison with Prior Nuclear Study: No previous nuclear study performed  Overall Impression:  High risk stress nuclear study demonstrating exercise induced chest pain with ischemic ECG changes and inferolateral to lateral-inferior mid to basal ischemia.  LV Wall Motion:  EF 45 % with mild basal lateral inferior hypokinesis; Recommend cardiac catheterization.   Lennette BihariKELLY,THOMAS A, MD  06/05/2014 3:09 PM

## 2014-06-05 NOTE — Progress Notes (Signed)
Patient ID: Tyler Holder MRN: [id number], DOB/AGE: 03/03/1961   Admit date: (Not on file)   Primary Physician: Leanor RubensteinSUN,VYVYAN Y, MD Primary Cardiologist: Dr Allyson SabalBerry  HPI: 53 y/o male who went to his primary care provider in May with complaints of chest pain. He saw Dr Allyson SabalBerry and was set up to have an exercise Myoview today in our office. At peak exercise he had 10/10 chest pain with inf/lat TWI. This improved with NTG. He is currently pain free. He was seen by Dr Tresa EndoKelly and it was felt it would be best to admit him today for cath in am. He is currently pain free.    Problem List: Past Medical History  Diagnosis Date  . Hypertension   . Chest pain   . Family history of heart disease     No past surgical history on file.   Allergies: No Known Allergies   Home Medications Current Outpatient Prescriptions  Medication Sig Dispense Refill  . ALPRAZolam (XANAX) 0.25 MG tablet Take 0.25 mg by mouth as needed.       [name]. amLODipine (NORVASC) 5 MG tablet Take 1 tablet (5 mg total) by mouth daily.  180 tablet  3  . aspirin 81 MG tablet Take 81 mg by mouth daily.      [name]. ibuprofen (ADVIL,MOTRIN) 200 MG tablet Take 600 mg by mouth every morning.      [name]. lisinopril (PRINIVIL,ZESTRIL) 20 MG tablet Take 20 mg by mouth daily.       . pantoprazole (PROTONIX) 40 MG tablet Take 1 tablet (40 mg total) by mouth daily.  30 tablet  11   No current facility-administered medications for this visit.     FMHX- F died MI 3263, mother had Takostubo MI   History   Social History  . Marital Status: Married    Spouse Name: N/A    Number of Children: N/A  . Years of Education: N/A   Occupational History  . Not on file.   Social History Main Topics  . Smoking status: Never Smoker   . Smokeless tobacco: Not on file  . Alcohol Use: No  . Drug Use: No  . Sexual Activity: Not on file   Other Topics Concern  . Not on file   Social History Narrative  . No narrative on file     Review of  Systems: General: negative for chills, fever, night sweats or weight changes.  Cardiovascular: negative for chest pain, dyspnea on exertion, edema, orthopnea, palpitations, paroxysmal nocturnal dyspnea or shortness of breath Dermatological: negative for rash Respiratory: negative for cough or wheezing Urologic: negative for hematuria Abdominal: negative for nausea, vomiting, diarrhea, bright red blood per rectum, melena, or hematemesis Neurologic: negative for visual changes, syncope, or dizziness All other systems reviewed and are otherwise negative except as noted above.  Physical Exam: Blood pressure 166/98, pulse 75, height 5\' 10"  (1.778 m), weight 195 lb (88.451 kg).  General appearance: alert, cooperative, no distress and anxious Neck: no carotid bruit and no JVD Lungs: clear to auscultation bilaterally Heart: regular rate and rhythm, S1, S2 normal, no murmur, click, rub or gallop Abdomen: soft, non-tender; bowel sounds normal; no masses,  no organomegaly Extremities: extremities normal, atraumatic, no cyanosis or edema Pulses: 2+ and symmetric Skin: Skin color, texture, turgor normal. No rashes or lesions Neurologic: Grossly normal    Labs:  No results found for this or any previous visit (from the past 24 hour(s)).   Radiology/Studies: No results found.  EKG:NSR  ASSESSMENT AND PLAN:  [place] Abnormal Myoview HTN Family history of CAD   PLAN: Admit- Heparin, NTG, cath in am.   SignedAbelino Derrick, KILROY,LUKE K, PA-C 06/05/2014, 3:36 PM

## 2014-06-05 NOTE — H&P (Signed)
Patient ID: Eston MouldRoger Bonham  MRN: [id number], DOB/AGE: [birth year]  Admit date: (Not on file)  Primary Physician: Leanor RubensteinSUN,VYVYAN Y, MD  Primary Cardiologist: Dr Allyson SabalBerry  HPI: 53 y/o male who went to his primary care Aritha Huckeba in May with complaints of chest pain. He saw Dr Allyson SabalBerry and was set up to have an exercise Myoview today in our office. At peak exercise he had 10/10 chest pain with inf/lat TWI. This improved with NTG. He is currently pain free. He was seen by Dr Tresa EndoKelly and it was felt it would be best to admit him today for cath in am. He is currently pain free.  Problem List:  Past Medical History   Diagnosis  Date   .  Hypertension    .  Chest pain    .  Family history of heart disease    No past surgical history on file.  Allergies: No Known Allergies  Home Medications  Current Outpatient Prescriptions   Medication  Sig  Dispense  Refill   .  ALPRAZolam (XANAX) 0.25 MG tablet  Take 0.25 mg by mouth as needed.     [name].  amLODipine (NORVASC) 5 MG tablet  Take 1 tablet (5 mg total) by mouth daily.  180 tablet  3   .  aspirin 81 MG tablet  Take 81 mg by mouth daily.     [name].  ibuprofen (ADVIL,MOTRIN) 200 MG tablet  Take 600 mg by mouth every morning.     [name].  lisinopril (PRINIVIL,ZESTRIL) 20 MG tablet  Take 20 mg by mouth daily.     .  pantoprazole (PROTONIX) 40 MG tablet  Take 1 tablet (40 mg total) by mouth daily.  30 tablet  11    No current facility-administered medications for this visit.   FMHX- F died MI 1663, mother had Takostubo MI  History    Social History   .  Marital Status:  Married     Spouse Name:  N/A     Number of Children:  N/A   .  Years of Education:  N/A    Occupational History   .  Not on file.    Social History Main Topics   .  Smoking status:  Never Smoker   .  Smokeless tobacco:  Not on file   .  Alcohol Use:  No   .  Drug Use:  No   .  Sexual Activity:  Not on file    Other Topics  Concern   .  Not on file    Social History Narrative   .  No narrative on file     Review of Systems:  General: negative for chills, fever, night sweats or weight changes.  Cardiovascular: negative for chest pain, dyspnea on exertion, edema, orthopnea, palpitations, paroxysmal nocturnal dyspnea or shortness of breath  Dermatological: negative for rash  Respiratory: negative for cough or wheezing  Urologic: negative for hematuria  Abdominal: negative for nausea, vomiting, diarrhea, bright red blood per rectum, melena, or hematemesis  Neurologic: negative for visual changes, syncope, or dizziness  All other systems reviewed and are otherwise negative except as noted above.  Physical Exam:  Blood pressure 166/98, pulse 75, height 5\' 10"  (1.778 m), weight 195 lb (88.451 kg).  General appearance: alert, cooperative, no distress and anxious  Neck: no carotid bruit and no JVD  Lungs: clear to auscultation bilaterally  Heart: regular rate and rhythm, S1, S2 normal, no murmur, click, rub or gallop  Abdomen: soft, non-tender;  bowel sounds normal; no masses, no organomegaly  Extremities: extremities normal, atraumatic, no cyanosis or edema  Pulses: 2+ and symmetric  Skin: Skin color, texture, turgor normal. No rashes or lesions  Neurologic: Grossly normal  Labs:  No results found for this or any previous visit (from the past 24 hour(s)).  Radiology/Studies: No results found.   EKG:NSR   ASSESSMENT AND PLAN:  [place]  Abnormal Myoview  HTN  Family history of CAD   PLAN: Admit- Heparin, NTG, cath in am.   Signed,  Abelino DerrickKILROY,LUKE K, PA-  Patient seen and examined. Agree with assessment and plan. Very pleasant 53 yo WM who is admitted from the office today after experiencing 10/10 chest pain on an exercise nuclear study. Patient had baseline T wave abnormality in leads 3 and V6, and developed more pronounced inferolaterally ST changes with T inversion inferolaterrally. Chest pain ultimately resolved with sl NTG. Scintigraphic images demonstrate moderate ischemia in the mid to  basal inferolateral and lateral inferior segment. Patient felt very weak after the study. Will admit, start IV heparin, nitrates and low dose beta blocker. Recommend definitive cardiac cath tomorrow; discussed risks/benefits and potential need for PCI.Pt and wife agreeable.   Lennette Biharihomas A. Kelly, MD, [facility] 06/05/2014 5:16 PM

## 2014-06-06 ENCOUNTER — Encounter (HOSPITAL_COMMUNITY)
Admission: AD | Disposition: A | Discharge status: Home / Self Care | Payer: Self-pay | Source: Ambulatory Visit | Attending: Cardiovascular Disease

## 2014-06-06 ENCOUNTER — Encounter: Payer: Self-pay | Admitting: Cardiology

## 2014-06-06 DIAGNOSIS — I2510 Atherosclerotic heart disease of native coronary artery without angina pectoris: Principal | ICD-10-CM

## 2014-06-06 DIAGNOSIS — E785 Hyperlipidemia, unspecified: Secondary | ICD-10-CM

## 2014-06-06 HISTORY — PX: CORONARY ANGIOPLASTY WITH STENT PLACEMENT: SHX49

## 2014-06-06 HISTORY — PX: PERCUTANEOUS CORONARY STENT INTERVENTION (PCI-S): SHX5485

## 2014-06-06 HISTORY — PX: LEFT HEART CATHETERIZATION WITH CORONARY ANGIOGRAM: SHX5451

## 2014-06-06 LAB — LIPID PANEL
Cholesterol: 205 mg/dL — ABNORMAL HIGH (ref 0–200)
HDL: 29 mg/dL — ABNORMAL LOW (ref >39)
LDL Cholesterol: 133 mg/dL — ABNORMAL HIGH (ref 0–99)
Total CHOL/HDL Ratio: 7.1 RATIO
Triglycerides: 214 mg/dL — ABNORMAL HIGH (ref <150)
VLDL: 43 mg/dL — ABNORMAL HIGH (ref 0–40)

## 2014-06-06 LAB — CBC
HCT: 41.6 % (ref 39.0–52.0)
HEMOGLOBIN: 14.1 g/dL (ref 13.0–17.0)
MCH: 28.9 pg (ref 26.0–34.0)
MCHC: 33.9 g/dL (ref 30.0–36.0)
MCV: 85.2 fL (ref 78.0–100.0)
PLATELETS: 204 10*3/uL (ref 150–400)
RBC: 4.88 MIL/uL (ref 4.22–5.81)
RDW: 12.9 % (ref 11.5–15.5)
WBC: 9 10*3/uL (ref 4.0–10.5)

## 2014-06-06 LAB — BASIC METABOLIC PANEL
BUN: 16 mg/dL (ref 6–23)
CO2: 23 mEq/L (ref 19–32)
Calcium: 9 mg/dL (ref 8.4–10.5)
Chloride: 102 mEq/L (ref 96–112)
Creatinine, Ser: 0.89 mg/dL (ref 0.50–1.35)
GFR calc Af Amer: >90 mL/min (ref >90)
GFR calc non Af Amer: >90 mL/min (ref >90)
Glucose, Bld: 103 mg/dL — ABNORMAL HIGH (ref 70–99)
Potassium: 4 mEq/L (ref 3.7–5.3)
Sodium: 139 mEq/L (ref 137–147)

## 2014-06-06 LAB — HEPARIN LEVEL (UNFRACTIONATED): HEPARIN UNFRACTIONATED: 0.38 [IU]/mL (ref 0.30–0.70)

## 2014-06-06 LAB — TROPONIN I
Troponin I: <0.3 ng/mL (ref <0.30)
Troponin I: <0.3 ng/mL (ref <0.30)

## 2014-06-06 LAB — POCT ACTIVATED CLOTTING TIME
ACTIVATED CLOTTING TIME: 242 s
ACTIVATED CLOTTING TIME: 293 s

## 2014-06-06 SURGERY — LEFT HEART CATHETERIZATION WITH CORONARY ANGIOGRAM
Anesthesia: LOCAL

## 2014-06-06 MED ORDER — MIDAZOLAM HCL 2 MG/2ML IJ SOLN
INTRAMUSCULAR | Status: AC
  Filled 2014-06-06: qty 2

## 2014-06-06 MED ORDER — VERAPAMIL HCL 2.5 MG/ML IV SOLN
INTRAVENOUS | Status: AC
  Filled 2014-06-06: qty 2

## 2014-06-06 MED ORDER — HEPARIN (PORCINE) IN NACL 2-0.9 UNIT/ML-% IJ SOLN
INTRAMUSCULAR | Status: AC
  Filled 2014-06-06: qty 1000

## 2014-06-06 MED ORDER — ATORVASTATIN CALCIUM 80 MG PO TABS
80.0000 mg | ORAL_TABLET | Freq: Every day | ORAL | Status: DC
  Administered 2014-06-06: 80 mg via ORAL
  Filled 2014-06-06 (×2): qty 1

## 2014-06-06 MED ORDER — ACETAMINOPHEN 325 MG PO TABS
650.0000 mg | ORAL_TABLET | Freq: Four times a day (QID) | ORAL | Status: DC | PRN
  Administered 2014-06-06: 650 mg via ORAL
  Filled 2014-06-06: qty 2

## 2014-06-06 MED ORDER — ADENOSINE 12 MG/4ML IV SOLN
16.0000 mL | Freq: Once | INTRAVENOUS | Status: DC
  Filled 2014-06-06: qty 16

## 2014-06-06 MED ORDER — SODIUM CHLORIDE 0.9 % IV SOLN
1.0000 mL/kg/h | INTRAVENOUS | Status: AC
  Administered 2014-06-06: 1 mL/kg/h via INTRAVENOUS

## 2014-06-06 MED ORDER — FENTANYL CITRATE 0.05 MG/ML IJ SOLN
INTRAMUSCULAR | Status: AC
  Filled 2014-06-06: qty 2

## 2014-06-06 MED ORDER — ONDANSETRON HCL 4 MG/2ML IJ SOLN: 4.0000 mg | Freq: Four times a day (QID) | INTRAMUSCULAR | Status: DC | PRN

## 2014-06-06 MED ORDER — CLOPIDOGREL BISULFATE 75 MG PO TABS
75.0000 mg | ORAL_TABLET | Freq: Every day | ORAL | Status: DC
  Administered 2014-06-07: 75 mg via ORAL
  Filled 2014-06-06: qty 1

## 2014-06-06 MED ORDER — TIROFIBAN HCL IV 5 MG/100ML
0.1500 ug/kg/min | INTRAVENOUS | Status: AC
  Administered 2014-06-06 (×2): 0.15 ug/kg/min via INTRAVENOUS
  Filled 2014-06-06: qty 100

## 2014-06-06 MED ORDER — LIDOCAINE HCL (PF) 1 % IJ SOLN
INTRAMUSCULAR | Status: AC
  Filled 2014-06-06: qty 30

## 2014-06-06 MED ORDER — OXYCODONE HCL 5 MG PO TABS
5.0000 mg | ORAL_TABLET | ORAL | Status: DC | PRN
  Administered 2014-06-06: 5 mg via ORAL
  Filled 2014-06-06: qty 1

## 2014-06-06 MED ORDER — TIROFIBAN HCL IV 5 MG/100ML
INTRAVENOUS | Status: AC
  Filled 2014-06-06: qty 100

## 2014-06-06 MED ORDER — HEPARIN SODIUM (PORCINE) 1000 UNIT/ML IJ SOLN
INTRAMUSCULAR | Status: AC
  Filled 2014-06-06: qty 1

## 2014-06-06 MED ORDER — ASPIRIN 81 MG PO CHEW: 81.0000 mg | CHEWABLE_TABLET | Freq: Every day | ORAL | Status: DC

## 2014-06-06 MED ORDER — CLOPIDOGREL BISULFATE 300 MG PO TABS
ORAL_TABLET | ORAL | Status: AC
Start: 2014-06-06 — End: 2014-06-06
  Filled 2014-06-06: qty 2

## 2014-06-06 MED ORDER — ONDANSETRON HCL 4 MG/2ML IJ SOLN
INTRAMUSCULAR | Status: AC
  Filled 2014-06-06: qty 2

## 2014-06-06 MED ORDER — NITROGLYCERIN 0.2 MG/ML ON CALL CATH LAB
INTRAVENOUS | Status: AC
  Filled 2014-06-06: qty 1

## 2014-06-06 NOTE — CV Procedure (Addendum)
PROCEDURE:  Left heart catheterization with selective coronary angiography, left ventriculogram.  FFR LAD.  PCI RCA  INDICATIONS:  Abnormal stress test with class III angina  The risks, benefits, and details of the procedure were explained to the patient.  The patient verbalized understanding and wanted to proceed.  Informed written consent was obtained.  PROCEDURE TECHNIQUE:  After Xylocaine anesthesia a 80F slender sheath was placed in the right radial artery with a single anterior needle wall stick.  IV heparin was given. Right coronary angiography was done using a Judkins R4 guide catheter.  Left coronary angiography was done using a Judkins L3.5 guide catheter.  Left ventriculography was done using a pigtail catheter.  A TR band was used for hemostasis.   CONTRAST:  Total of 155 cc.  COMPLICATIONS:  None.    HEMODYNAMICS:  Aortic pressure was 119/74; LV pressure was 122/4; LVEDP 11.  There was no gradient between the left ventricle and aorta.    ANGIOGRAPHIC DATA:   The left main coronary artery is widely patent.  The left anterior descending artery is is a large vessel proximally. There is a large first diagonal which has an early takeoff. Remainder of the LAD is medium size. There is moderate disease in the mid vessel. The LAD does not reach the apex.  There is a small second diagonal which is patent  The left circumflex artery is a large vessel. It is occluded proximally. There is some antegrade flow into the true AV groove circumflex. There is a large, obtuse marginal which has some antegrade flow. The majority of the circumflex system fills from collaterals from the large RCA..  The right coronary artery is a large dominant vessel. There is a 90% stenosis proximally. There appears to be a partial, spontaneous dissection at the area of plaque. This was likely the site of a ruptured plaque. In the mid vessel, there is mild atherosclerosis. The posterior descending artery is very  large and feeds blood to the apex. The posterolateral artery is also large. There are collaterals from the posterolateral artery to the circumflex system. The posterior descending artery gives off collaterals which seemed to feed a large obtuse marginal vessel which is also occluded.  LEFT VENTRICULOGRAM:  Left ventricular angiogram was done in the 30 RAO projection and revealed overall normal left ventricular systolic function with an estimated ejection fraction of 60 %. There is a small focal area of basal inferior hypokinesis. LVEDP was 11 mmHg.  PCI NARRATIVE:  A CLS 3.0 guiding catheter was used to engage the left main. Additional IV heparin was given. An FFR wire was advanced to the left main. It was normalized. It was then used to cross the lesion the LAD. Resting FFR was 0.92. After IV adenosine was given, the FFR decreased only to 0.82. The LAD disease was deemed not significant.  Since there is no significant disease in the LAD, we felt that bypass surgery would not be as beneficial. Therefore we pursued percutaneous intervention.  A JR 4 guiding catheters using to engage the RCA. IV tirofiban was added in addition. A pro-water wire was carefully placed across the area disease in the RCA and advanced to the distal right coronary artery. A 3.0 x 15 balloon was used to predilate the proximal RCA. A 3.5 x 18 resolute  drug-eluting stent was then deployed. The stent was post dilated with a 3.75 x 12 noncompliant balloon with several inflations, up to 18 atmospheres. Intracoronary nitroglycerin  was given. During the case, there was some vasospasm in the arm which prompted the use of intra-arterial nitroglycerin.  There was an excellent angiographic result. There is no residual stenosis.  IMPRESSIONS:  1. Normal left main coronary artery. 2. Moderate disease in the medium sized mid left anterior descending artery. This was not significant by FFR as noted above. 3. Occluded proximal left circumflex  artery. It does appear that there is a small channel for antegrade flow into a large obtuse marginal and the remainder of the circumflex. There are brisk collaterals coming from the RCA system to the lateral wall. 4. 90% stenosis in the proximal right coronary artery.  Mild disease in the remainder of the RCA. There are large epicardial collaterals from the RCA to the circumflex and to a large obtuse marginal vessel.  I believe the RCA lesion is the culprit for his symptoms. This was successfully stented with a 3.5 x 18 resolute drug-eluting stent, postdilated to 3.8 mm in diameter.  5. Overall normal left ventricular systolic function.  LVEDP 11 mmHg.  Ejection fraction 60 %.  RECOMMENDATION:  Continue aggressive medical therapy. He will receive IV tirofiban for 6 hours. He will need dual antiplatelet therapy for at least a year. Possible discharge tomorrow.  If the patient has anginal symptoms, he would be a reasonable candidate for PCI of the proximal circumflex. This does appear chronically occluded but there is likely a channel through which the lesion could be crossed. He will followup with Dr. Allyson SabalBerry. I discussed these findings with the family and patient at length.

## 2014-06-06 NOTE — Progress Notes (Signed)
 TELEMETRY: Reviewed telemetry pt in NSR: Filed Vitals:   06/06/14 0600 06/06/14 0700 06/06/14 0800 06/06/14 0900  BP: 139/88 138/92 149/94 144/92  Pulse: 59 60 62 83  Temp:  98.1 F (36.7 C)    TempSrc:  Oral    Resp: 8 0 16 17  Height:      Weight:      SpO2: 97% 99% 98% 98%    Intake/Output Summary (Last 24 hours) at 06/06/14 1037 Last data filed at 06/06/14 0900  Gross per 24 hour  Intake 1643.4 ml  Output    800 ml  Net  843.4 ml   Filed Weights   06/05/14 1700  Weight: 188 lb 0.8 oz (85.3 kg)    Subjective No chest pain since admission. Feels well.  . amLODipine  5 mg Oral Daily  . aspirin EC  81 mg Oral Daily  . atorvastatin  20 mg Oral q1800  . lisinopril  20 mg Oral Daily  . pantoprazole  40 mg Oral Q0600   . sodium chloride 75 mL/hr at 06/06/14 0900  . heparin 1,250 Units/hr (06/06/14 0900)  . nitroGLYCERIN Stopped (06/06/14 0252)    LABS: Basic Metabolic Panel:  Recent Labs  06/05/14 1700 06/06/14 0405  NA 140 139  K 4.1 4.0  CL 101 102  CO2 25 23  GLUCOSE 94 103*  BUN 20 16  CREATININE 1.03 0.89  CALCIUM 9.6 9.0   Liver Function Tests:  Recent Labs  06/05/14 1700  AST 18  ALT 27  ALKPHOS 74  BILITOT 0.4  PROT 7.3  ALBUMIN 4.1   No results found for this basename: LIPASE, AMYLASE,  in the last 72 hours CBC:  Recent Labs  06/05/14 1700 06/06/14 0405  WBC 9.8 9.0  NEUTROABS 6.2  --   HGB 15.2 14.1  HCT 45.0 41.6  MCV 86.9 85.2  PLT 222 204   Cardiac Enzymes:  Recent Labs  06/05/14 1700 06/06/14 0124 06/06/14 0405  TROPONINI <0.30 <0.30 <0.30   BNP: No results found for this basename: PROBNP,  in the last 72 hours D-Dimer: No results found for this basename: DDIMER,  in the last 72 hours Hemoglobin A1C: No results found for this basename: HGBA1C,  in the last 72 hours Fasting Lipid Panel:  Recent Labs  06/06/14 0405  CHOL 205*  HDL 29*  LDLCALC 133*  TRIG 214*  CHOLHDL 7.1   Thyroid Function  Tests:  Recent Labs  06/05/14 1700  TSH 2.990     Radiology/Studies:  No results found.  PHYSICAL EXAM General: Well developed, well nourished, in no acute distress. Head: Normocephalic, atraumatic, sclera non-icteric, oropharynx is clear Neck: Negative for carotid bruits. JVD not elevated. No adenopathy Lungs: Clear bilaterally to auscultation without wheezes, rales, or rhonchi. Breathing is unlabored. Heart: RRR S1 S2 without murmurs, rubs, or gallops.  Abdomen: Soft, non-tender, non-distended with normoactive bowel sounds. No hepatomegaly. No rebound/guarding. No obvious abdominal masses. Msk:  Strength and tone appears normal for age. Extremities: No clubbing, cyanosis or edema.  Distal pedal pulses are 2+ and equal bilaterally. Neuro: Alert and oriented X 3. Moves all extremities spontaneously. Psych:  Responds to questions appropriately with a normal affect.  ASSESSMENT AND PLAN: 1. Recent onset angina. High risk stress myoview with inferior and lateral ischemia. EF 45%. Plan to proceed with cardiac cath +/- PCI today. The procedure and risks were reviewed including but not limited to death, myocardial infarction, stroke, arrythmias, bleeding, transfusion, emergency surgery, dye   allergy, or renal dysfunction. The patient voices understanding and is agreeable to proceed.  2. Hyperlipidemia. Will initiate high dose statin.  3. HTN on amlodipine and ACEi.  4. Family history of CAD  Present on Admission:  . Unstable angina  Signed, Dyan Labarbera [place], MDFACC 06/06/2014 10:37 AM

## 2014-06-06 NOTE — Interval H&P Note (Signed)
Cath Lab Visit (complete for each Cath Lab visit)  Clinical Evaluation Leading to the Procedure:   ACS: no  Non-ACS:    Anginal Classification: CCS III  Anti-ischemic medical therapy: Minimal Therapy (1 class of medications)  Non-Invasive Test Results: Intermediate-risk stress test findings: cardiac mortality 1-3%/year  Prior CABG: No previous CABG   Some rest pain that has awoken him at night.   History and Physical Interval Note:  06/06/2014 12:58 PM  Tyler Holder S Tyler Holder  has presented today for surgery, with the diagnosis of abnormal nuclear stress test  The various methods of treatment have been discussed with the patient and family. After consideration of risks, benefits and other options for treatment, the patient has consented to  Procedure(s): LEFT HEART CATHETERIZATION WITH CORONARY ANGIOGRAM (N/A) as a surgical intervention .  The patient's history has been reviewed, patient examined, no change in status, stable for surgery.  I have reviewed the patient's chart and labs.  Questions were answered to the patient's satisfaction.     Tyler,JAYADEEP S.

## 2014-06-06 NOTE — Progress Notes (Signed)
Utilization review completed. Bertha Stanfill, RN, BSN. 

## 2014-06-06 NOTE — H&P (View-Only) (Signed)
TELEMETRY: Reviewed telemetry pt in NSR: Filed Vitals:   06/06/14 0600 06/06/14 0700 06/06/14 0800 06/06/14 0900  BP: 139/88 138/92 149/94 144/92  Pulse: 59 60 62 83  Temp:  98.1 F (36.7 C)    TempSrc:  Oral    Resp: 8 0 16 17  Height:      Weight:      SpO2: 97% 99% 98% 98%    Intake/Output Summary (Last 24 hours) at 06/06/14 1037 Last data filed at 06/06/14 0900  Gross per 24 hour  Intake 1643.4 ml  Output    800 ml  Net  843.4 ml   Filed Weights   06/05/14 1700  Weight: 188 lb 0.8 oz (85.3 kg)    Subjective No chest pain since admission. Feels well.  [name]. amLODipine  5 mg Oral Daily  . aspirin EC  81 mg Oral Daily  . atorvastatin  20 mg Oral q1800  . lisinopril  20 mg Oral Daily  . pantoprazole  40 mg Oral Q0600   . sodium chloride 75 mL/hr at 06/06/14 0900  . heparin 1,250 Units/hr (06/06/14 0900)  . nitroGLYCERIN Stopped (06/06/14 0252)    LABS: Basic Metabolic Panel:  Recent Labs  16/09/9605/18/15 1700 06/06/14 0405  NA 140 139  K 4.1 4.0  CL 101 102  CO2 25 23  GLUCOSE 94 103*  BUN 20 16  CREATININE 1.03 0.89  CALCIUM 9.6 9.0   Liver Function Tests:  Recent Labs  06/05/14 1700  AST 18  ALT 27  ALKPHOS 74  BILITOT 0.4  PROT 7.3  ALBUMIN 4.1   No results found for this basename: LIPASE, AMYLASE,  in the last 72 hours CBC:  Recent Labs  06/05/14 1700 06/06/14 0405  WBC 9.8 9.0  NEUTROABS 6.2  --   HGB 15.2 14.1  HCT 45.0 41.6  MCV 86.9 85.2  PLT 222 204   Cardiac Enzymes:  Recent Labs  06/05/14 1700 06/06/14 0124 06/06/14 0405  TROPONINI <0.30 <0.30 <0.30   BNP: No results found for this basename: PROBNP,  in the last 72 hours D-Dimer: No results found for this basename: DDIMER,  in the last 72 hours Hemoglobin A1C: No results found for this basename: HGBA1C,  in the last 72 hours Fasting Lipid Panel:  Recent Labs  06/06/14 0405  CHOL 205*  HDL 29*  LDLCALC 133*  TRIG 214*  CHOLHDL 7.1   Thyroid Function  Tests:  Recent Labs  06/05/14 1700  TSH 2.990     Radiology/Studies:  No results found.  PHYSICAL EXAM General: Well developed, well nourished, in no acute distress. Head: Normocephalic, atraumatic, sclera non-icteric, oropharynx is clear Neck: Negative for carotid bruits. JVD not elevated. No adenopathy Lungs: Clear bilaterally to auscultation without wheezes, rales, or rhonchi. Breathing is unlabored. Heart: RRR S1 S2 without murmurs, rubs, or gallops.  Abdomen: Soft, non-tender, non-distended with normoactive bowel sounds. No hepatomegaly. No rebound/guarding. No obvious abdominal masses. Msk:  Strength and tone appears normal for age. Extremities: No clubbing, cyanosis or edema.  Distal pedal pulses are 2+ and equal bilaterally. Neuro: Alert and oriented X 3. Moves all extremities spontaneously. Psych:  Responds to questions appropriately with a normal affect.  ASSESSMENT AND PLAN: 1. Recent onset angina. High risk stress myoview with inferior and lateral ischemia. EF 45%. Plan to proceed with cardiac cath +/- PCI today. The procedure and risks were reviewed including but not limited to death, myocardial infarction, stroke, arrythmias, bleeding, transfusion, emergency surgery, dye  allergy, or renal dysfunction. The patient voices understanding and is agreeable to proceed.  2. Hyperlipidemia. Will initiate high dose statin.  3. HTN on amlodipine and ACEi.  4. Family history of CAD  Present on Admission:  . Unstable angina  Signed, Peter SwazilandJordan, MDFACC 06/06/2014 10:37 AM

## 2014-06-06 NOTE — Progress Notes (Signed)
ANTICOAGULATION CONSULT NOTE   Pharmacy Consult for Heparin Indication: chest pain/ACS  No Known Allergies  Patient Measurements: Height:  70 inches Weight:  195 pounds (88kg) Heparin dosing Weight:  78kg  Vital Signs: Temp: 98.1 F (36.7 C) (06/19 0400) Temp src: Oral (06/19 0400) BP: 144/82 mmHg (06/19 0400) Pulse Rate: 67 (06/19 0400)  Medical History: Past Medical History  Diagnosis Date  . Hypertension   . Chest pain   . Family history of heart disease    Medications:  Infusions:  . sodium chloride 75 mL/hr at 06/05/14 2000  . heparin 1,250 Units/hr (06/05/14 1834)  . nitroGLYCERIN Stopped (06/06/14 0252)   Assessment: 53yo male admitted from primary care MD for cardiac work up due to complaints of chest pain.  He is s/p nuc-med evaluation which was abnormal and we have been asked to dose IV heparin until he goes for cardiac cath. His initial heparin level is therapeutic.  Plan cath at 12:00 today Goal of Therapy:  Heparin level 0.3-0.7 units/ml Monitor platelets by anticoagulation protocol: Yes   Plan:  Cont drip at 1250 units/hr. F/u after cath  Thanks for allowing pharmacy to be a part of this patient's care.  Talbert CageLora Seay, PharmD Clinical Pharmacist, [phone]

## 2014-06-07 ENCOUNTER — Encounter (HOSPITAL_COMMUNITY): Payer: Self-pay | Admitting: Cardiology

## 2014-06-07 DIAGNOSIS — E785 Hyperlipidemia, unspecified: Secondary | ICD-10-CM | POA: Diagnosis present

## 2014-06-07 DIAGNOSIS — I200 Unstable angina: Secondary | ICD-10-CM

## 2014-06-07 DIAGNOSIS — I2510 Atherosclerotic heart disease of native coronary artery without angina pectoris: Secondary | ICD-10-CM | POA: Diagnosis present

## 2014-06-07 DIAGNOSIS — R9430 Abnormal result of cardiovascular function study, unspecified: Secondary | ICD-10-CM

## 2014-06-07 DIAGNOSIS — I10 Essential (primary) hypertension: Secondary | ICD-10-CM

## 2014-06-07 DIAGNOSIS — R9439 Abnormal result of other cardiovascular function study: Secondary | ICD-10-CM | POA: Diagnosis present

## 2014-06-07 LAB — BASIC METABOLIC PANEL
BUN: 14 mg/dL (ref 6–23)
CHLORIDE: 101 meq/L (ref 96–112)
CO2: 22 meq/L (ref 19–32)
Calcium: 9.1 mg/dL (ref 8.4–10.5)
Creatinine, Ser: 1.04 mg/dL (ref 0.50–1.35)
GFR calc non Af Amer: 80 mL/min — ABNORMAL LOW (ref >90)
Glucose, Bld: 111 mg/dL — ABNORMAL HIGH (ref 70–99)
POTASSIUM: 3.9 meq/L (ref 3.7–5.3)
Sodium: 139 mEq/L (ref 137–147)

## 2014-06-07 LAB — CBC
HEMATOCRIT: 42.3 % (ref 39.0–52.0)
Hemoglobin: 14.3 g/dL (ref 13.0–17.0)
MCH: 29.1 pg (ref 26.0–34.0)
MCHC: 33.8 g/dL (ref 30.0–36.0)
MCV: 86 fL (ref 78.0–100.0)
Platelets: 203 10*3/uL (ref 150–400)
RBC: 4.92 MIL/uL (ref 4.22–5.81)
RDW: 13 % (ref 11.5–15.5)
WBC: 11.1 10*3/uL — ABNORMAL HIGH (ref 4.0–10.5)

## 2014-06-07 MED ORDER — ATORVASTATIN CALCIUM 80 MG PO TABS: 80.0000 mg | ORAL_TABLET | Freq: Every day | ORAL | Status: DC

## 2014-06-07 MED ORDER — NITROGLYCERIN 0.4 MG SL SUBL: 0.4000 mg | SUBLINGUAL_TABLET | SUBLINGUAL | Status: AC | PRN

## 2014-06-07 MED ORDER — LISINOPRIL 20 MG PO TABS: 20.0000 mg | ORAL_TABLET | Freq: Every day | ORAL | Status: DC

## 2014-06-07 MED ORDER — PANTOPRAZOLE SODIUM 40 MG PO TBEC
40.0000 mg | DELAYED_RELEASE_TABLET | Freq: Every day | ORAL | Status: DC
  Administered 2014-06-07: 40 mg via ORAL
  Filled 2014-06-07: qty 1

## 2014-06-07 MED ORDER — CLOPIDOGREL BISULFATE 75 MG PO TABS: 75.0000 mg | ORAL_TABLET | Freq: Every day | ORAL | Status: DC

## 2014-06-07 NOTE — Progress Notes (Signed)
CARDIAC REHAB PHASE  PRE:  Rate/Rhythm Sr 70  BP:  Supine:   Sitting: 119/72  Standing:    SaO2: 97 RA  MODE:  Ambulation: 350 ft                           Rhythm  SR 86  BP:  Supine:   Sitting: 109/64  Standing:    SaO2: Sr 98  Ambulated in hallway with minimal assist x 350 feet accompanied by pt's wife. Pt tolerated well with no complaints of cp or sob.  Pt with some reported tenderness to his radial site.  Pt gingerly holding it on pillow.  In anticipation of discharge today, education completed including exercise guidelines,  NTG protocol, alert 911 for any unrelieved cp or sob, medication compliance with anti-platelet therapy and heart healthy nutrition.  Pt is interested in attending [facility] period [facility] time.  Pt avid exerciser prior and works two jobs.  Pt will check his insurance benefits for CR.  Questions answered, handouts provided.  Pt in bed with call bell within reach and wife is at bedside. Alanson Alyarlette Carlton RN, BSN [id number]

## 2014-06-07 NOTE — Discharge Summary (Signed)
Physician Discharge Summary       Patient ID: Tyler Holder MRN: [id number] DOB/AGE: 1961/10/09 53 y.o.  Admit date: 06/05/2014 Discharge date: 06/07/2014  Discharge Diagnoses:  Principal Problem:   Unstable angina Active Problems:   CAD, multiple vessel, culprit RCA -prox with DES placed, total LCX-old, mod LAD- FFR not Hemodynamically signficant 06/06/14    Abnormal nuclear stress test   Essential hypertension   Hyperlipidemia LDL goal <70   Discharged Condition: good  Procedures: 06/06/14 cardiac cath by Dr. Eldridge Dace 06/06/14 PCI with DES to 90% ulcerated proximal RCA plaque.  Hospital Course: 53 y/o male who went to his primary care provider in May with complaints of chest pain. He saw Dr Allyson Sabal and was set up to have an exercise Myoview-this pain is new onset and began 4 weeks ago. It waxes and wanes. It radiates to his throat and left upper extremity. His risk factors include hypertension and family history 06/05/14 in our office for stress test- At peak exercise he had 10/10 chest pain with inf/lat TWI. This improved with NTG.  He was seen by Dr. Tresa Endo and admitted to the hospital.   06/06/14 he underwent Cardiac cath: ANGIOGRAPHIC DATA: The left main coronary artery is widely patent.  The left anterior descending artery is is a large vessel proximally. There is a large first diagonal which has an early takeoff. Remainder of the LAD is medium size. There is moderate disease in the mid vessel. The LAD does not reach the apex. There is a small second diagonal which is patent  The left circumflex artery is a large vessel. It is occluded proximally. There is some antegrade flow into the true AV groove circumflex. There is a large, obtuse marginal which has some antegrade flow. The majority of the circumflex system fills from collaterals from the large RCA..  The right coronary artery is a large dominant vessel. There is a 90% stenosis proximally. There appears to be a partial, spontaneous  dissection at the area of plaque. This was likely the site of a ruptured plaque. In the mid vessel, there is mild atherosclerosis. The posterior descending artery is very large and feeds blood to the apex. The posterolateral artery is also large. There are collaterals from the posterolateral artery to the circumflex system. The posterior descending artery gives off collaterals which seemed to feed a large obtuse marginal vessel which is also occluded.  LEFT VENTRICULOGRAM: Left ventricular angiogram was done in the 30 RAO projection and revealed overall normal left ventricular systolic function with an estimated ejection fraction of 60 %. There is a small focal area of basal inferior hypokinesis. LVEDP was 11 mmHg.  1.  Normal left main coronary artery. 2. Moderate disease in the medium sized mid left anterior descending artery. This was not significant by FFR as noted above. 3. Occluded proximal left circumflex artery. It does appear that there is a small channel for antegrade flow into a large obtuse marginal and the remainder of the circumflex. There are brisk collaterals coming from the RCA system to the lateral wall. 4. 90% stenosis in the proximal right coronary artery. Mild disease in the remainder of the RCA. There are large epicardial collaterals from the RCA to the circumflex and to a large obtuse marginal vessel. I believe the RCA lesion is the culprit for his symptoms. This was successfully stented with a 3.5 x 18 resolute drug-eluting stent, postdilated to 3.8 mm in diameter.  5. Overall normal left ventricular systolic function. LVEDP 11  mmHg. Ejection fraction 60 %.  If the patient has anginal symptoms, he would be a reasonable candidate for PCI of the proximal circumflex. This does appear chronically occluded but there is likely a channel through which the lesion could be crossed. -per Dr. Eldridge DaceVaranasi.  EF on Nuc 45% on cath 60%. Statin added.  No BB due to S. Bradycardia.  Pt did well  overnight and was seen and evaluated by Dr. SwazilandJordan the next AM and found stable for discharge. He will be on dual antiplatelet therapy for 1 year at least.     Consults: None  Significant Diagnostic Studies:   BMET    Component Value Date/Time   NA 139 06/07/2014 0315   K 3.9 06/07/2014 0315   CL 101 06/07/2014 0315   CO2 22 06/07/2014 0315   GLUCOSE 111* 06/07/2014 0315   BUN 14 06/07/2014 0315   CREATININE 1.04 06/07/2014 0315   CALCIUM 9.1 06/07/2014 0315   GFRNONAA 80* 06/07/2014 0315   GFRAA >90 06/07/2014 0315    CBC    Component Value Date/Time   WBC 11.1* 06/07/2014 0315   RBC 4.92 06/07/2014 0315   HGB 14.3 06/07/2014 0315   HCT 42.3 06/07/2014 0315   PLT 203 06/07/2014 0315   MCV 86.0 06/07/2014 0315   MCH 29.1 06/07/2014 0315   MCHC 33.8 06/07/2014 0315   RDW 13.0 06/07/2014 0315   LYMPHSABS 2.7 06/05/2014 1700   MONOABS 0.8 06/05/2014 1700   EOSABS 0.2 06/05/2014 1700   BASOSABS 0.0 06/05/2014 1700   Negative Troponins X 3  Lipid Panel     Component Value Date/Time   CHOL 205* 06/06/2014 0405   TRIG 214* 06/06/2014 0405   HDL 29* 06/06/2014 0405   CHOLHDL 7.1 06/06/2014 0405   VLDL 43* 06/06/2014 0405   LDLCALC 133* 06/06/2014 0405   TSH 2.990  Nuclear stress test 06/05/14: Overall Impression: High risk stress nuclear study demonstrating  exercise induced chest pain with ischemic ECG changes and  inferolateral to lateral-inferior mid to basal ischemia. LV Wall Motion: EF 45 % with mild basal lateral inferior  hypokinesis; Recommend cardiac catheterization.  EKG at discharge: Sinus bradycardia Left axis deviation Abnormal ECG  Discharge Exam: Blood pressure 106/69, pulse 57, temperature 98.6 F (37 C), temperature source Oral, resp. rate 16, height 5\' 10"  (1.778 m), weight 188 lb 0.8 oz (85.3 kg), SpO2 95.00%.    Disposition: 01-Home or Self Care     Medication List    STOP taking these medications       ibuprofen 200 MG tablet  Commonly known as:  ADVIL,MOTRIN       TAKE these medications       ALPRAZolam 0.25 MG tablet  Commonly known as:  XANAX  Take 0.25 mg by mouth daily as needed for anxiety.     amLODipine 5 MG tablet  Commonly known as:  NORVASC  Take 1 tablet (5 mg total) by mouth daily.     aspirin 81 MG tablet  Take 81 mg by mouth daily.     atorvastatin 80 MG tablet  Commonly known as:  LIPITOR  Take 1 tablet (80 mg total) by mouth daily at 6 PM.     clopidogrel 75 MG tablet  Commonly known as:  PLAVIX  Take 1 tablet (75 mg total) by mouth daily with breakfast.     lisinopril 20 MG tablet  Commonly known as:  PRINIVIL,ZESTRIL  Take 1 tablet (20 mg total) by mouth daily.  nitroGLYCERIN 0.4 MG SL tablet  Commonly known as:  NITROSTAT  Place 1 tablet (0.4 mg total) under the tongue every 5 (five) minutes x 3 doses as needed for chest pain.     pantoprazole 40 MG tablet  Commonly known as:  PROTONIX  Take 1 tablet (40 mg total) by mouth daily.       Follow-up Information   Follow up with Runell GessBERRY,JONATHAN J, MD. (our office will call and arrange follow up appt. they should call you on Monday)    Specialty:  Cardiology   Contact information:   [address] [place] [id number] [phone]        Discharge Instructions: [facility] at [phone] if any bleeding, swelling or drainage at cath site.  May shower, no tub baths for 48 hours for groin sticks.   No lifting over 5 pounds for 3 days, no driving for 3 days.  Take 1 NTG, under your tongue, while sitting.  If no relief of pain may repeat NTG, one tab every 5 minutes up to 3 tablets total over 15 minutes.  If no relief CALL 911.  If you have dizziness/lightheadness  while taking NTG, stop taking and call 911.        Heart Healthy Diet  We want you to take both amlodipine and lisinopril.  We started Plavix for your stent- along with Asprin, do not stop -stopping could cause a heart attack.  If you need surgery- the  MD should call our office/Dr. Allyson SabalBerry.    Signed: Leone BrandINGOLD,LAURA R Nurse Practitioner-Certified [facility]: [facility] 06/07/2014, 7:56 AM  Time spent on discharge : >30 minutes.

## 2014-06-07 NOTE — Discharge Instructions (Signed)
Call [facility] at [phone] if any bleeding, swelling or drainage at cath site.  May shower, no tub baths for 48 hours for groin sticks.   No lifting over 5 pounds for 3 days, no driving for 3 days.  Take 1 NTG, under your tongue, while sitting.  If no relief of pain may repeat NTG, one tab every 5 minutes up to 3 tablets total over 15 minutes.  If no relief CALL 911.  If you have dizziness/lightheadness  while taking NTG, stop taking and call 911.        Heart Healthy Diet  We want you to take both amlodipine and lisinopril.  We started Plavix for your stent- along with Asprin, do not stop -stopping could cause a heart attack.  If you need surgery- the MD should call our office/Dr. Allyson SabalBerry.

## 2014-06-07 NOTE — Progress Notes (Signed)
Pt given discharge instructions. Pt verbalized understanding. Belongings given to pt. A&Ox4 and pain free. Wife at bedside to take home.

## 2014-06-07 NOTE — Progress Notes (Addendum)
TELEMETRY: Reviewed telemetry pt in NSR/ sinus brady: Filed Vitals:   06/07/14 0006 06/07/14 0200 06/07/14 0400 06/07/14 0439  BP:  107/67 106/69   Pulse:  58 57   Temp: 98.6 F (37 C)   98.6 F (37 C)  TempSrc: Oral   Oral  Resp:  16 16   Height:      Weight:      SpO2:  96% 95%     Intake/Output Summary (Last 24 hours) at 06/07/14 0718 Last data filed at 06/06/14 2200  Gross per 24 hour  Intake 2133.9 ml  Output   3090 ml  Net -956.1 ml   Filed Weights   06/05/14 1700  Weight: 188 lb 0.8 oz (85.3 kg)    Subjective No chest pain since admission. Feels well.  [name]. amLODipine  5 mg Oral Daily  . aspirin  81 mg Oral Daily  . aspirin EC  81 mg Oral Daily  . atorvastatin  80 mg Oral q1800  . clopidogrel  75 mg Oral Q breakfast  . lisinopril  20 mg Oral Daily  . pantoprazole  40 mg Oral Q0600      LABS: Basic Metabolic Panel:  Recent Labs  16/09/9605/19/15 0405 06/07/14 0315  NA 139 139  K 4.0 3.9  CL 102 101  CO2 23 22  GLUCOSE 103* 111*  BUN 16 14  CREATININE 0.89 1.04  CALCIUM 9.0 9.1   Liver Function Tests:  Recent Labs  06/05/14 1700  AST 18  ALT 27  ALKPHOS 74  BILITOT 0.4  PROT 7.3  ALBUMIN 4.1   No results found for this basename: LIPASE, AMYLASE,  in the last 72 hours CBC:  Recent Labs  06/05/14 1700 06/06/14 0405 06/07/14 0315  WBC 9.8 9.0 11.1*  NEUTROABS 6.2  --   --   HGB 15.2 14.1 14.3  HCT 45.0 41.6 42.3  MCV 86.9 85.2 86.0  PLT 222 204 203   Cardiac Enzymes:  Recent Labs  06/05/14 1700 06/06/14 0124 06/06/14 0405  TROPONINI <0.30 <0.30 <0.30   BNP: No results found for this basename: PROBNP,  in the last 72 hours D-Dimer: No results found for this basename: DDIMER,  in the last 72 hours Hemoglobin A1C: No results found for this basename: HGBA1C,  in the last 72 hours Fasting Lipid Panel:  Recent Labs  06/06/14 0405  CHOL 205*  HDL 29*  LDLCALC 133*  TRIG 214*  CHOLHDL 7.1   Thyroid Function  Tests:  Recent Labs  06/05/14 1700  TSH 2.990     Radiology/Studies:  No results found.  PHYSICAL EXAM General: Well developed, well nourished, in no acute distress. Head: Normocephalic, atraumatic, sclera non-icteric, oropharynx is clear Neck: Negative for carotid bruits. JVD not elevated. No adenopathy Lungs: Clear bilaterally to auscultation without wheezes, rales, or rhonchi. Breathing is unlabored. Heart: RRR S1 S2 without murmurs, rubs, or gallops.  Abdomen: Soft, non-tender, non-distended with normoactive bowel sounds. No hepatomegaly. No rebound/guarding. No obvious abdominal masses. Msk:  Strength and tone appears normal for age. Extremities: No clubbing, cyanosis or edema.  Distal pedal pulses are 2+ and equal bilaterally. No radial site hematoma.  Neuro: Alert and oriented X 3. Moves all extremities spontaneously. Psych:  Responds to questions appropriately with a normal affect.  ASSESSMENT AND PLAN: 1. Unstable angina. High risk stress myoview with inferior and lateral ischemia. EF 45% by myoview. 60% by cath. Cardiac cath showed culprit to be ulcerated 90% stenosis in the proximal  RCA. S/p DES. Also had chronic occlusion of the LCx. Modest disease in LAD but FFR suggests this is not hemodynamically significant. Plan DC today. Continue ASA and Plavix at least one year. Recommend he stay out of work at least 2 weeks. Encouraged outpatient cardiac Rehab.   2. Hyperlipidemia. Will initiate high dose statin.  3. HTN on amlodipine and ACEi. Not a candidate for beta blocker due to bradycardia.  4. Family history of CAD  Present on Admission:  . Unstable angina  Signed, Tyliah Schlereth [place], MDFACC 06/07/2014 7:18 AM

## 2014-06-08 NOTE — Discharge Summary (Signed)
Patient seen and examined and history reviewed. Agree with above findings and plan. Please see earlier rounding note.  Peter Swazil[place]Jordan, MDFACC 06/08/2014 10:09 AM

## 2014-06-09 ENCOUNTER — Telehealth: Payer: Self-pay | Admitting: Cardiology

## 2014-06-16 ENCOUNTER — Encounter: Payer: Self-pay | Admitting: Cardiology

## 2014-06-16 ENCOUNTER — Ambulatory Visit (INDEPENDENT_AMBULATORY_CARE_PROVIDER_SITE_OTHER): Payer: BC Managed Care – PPO | Admitting: Cardiology

## 2014-06-16 VITALS — BP 140/86 | Ht 70.0 in | Wt 193.0 lb

## 2014-06-16 DIAGNOSIS — I2510 Atherosclerotic heart disease of native coronary artery without angina pectoris: Secondary | ICD-10-CM

## 2014-06-16 DIAGNOSIS — E785 Hyperlipidemia, unspecified: Secondary | ICD-10-CM

## 2014-06-16 DIAGNOSIS — I10 Essential (primary) hypertension: Secondary | ICD-10-CM

## 2014-06-16 DIAGNOSIS — R0789 Other chest pain: Secondary | ICD-10-CM

## 2014-06-16 MED ORDER — ALPRAZOLAM 0.25 MG PO TABS: 0.2500 mg | ORAL_TABLET | Freq: Every day | ORAL | Status: DC | PRN

## 2014-06-16 NOTE — Assessment & Plan Note (Signed)
Stable with slow recovery.  Blood pressure and EKG stable one brief episode of chest discomfort but otherwise fatigue is his greatest complaint.  We'll keep him out of work for 4 more weeks to improve and have him see Dr. Allyson SabalBerry in 4 weeks.

## 2014-06-16 NOTE — Assessment & Plan Note (Signed)
Now on statin and will need to be rechecked in 3-4 months

## 2014-06-16 NOTE — Assessment & Plan Note (Signed)
Controlled.  

## 2014-06-16 NOTE — Patient Instructions (Signed)
1. Take Xanax as needed  2. Your physician recommends that you schedule a follow-up appointment in:  4 weeks with Dr. Allyson SabalBerry  3. No work until back to see Dr. Allyson SabalBerry

## 2014-06-16 NOTE — Progress Notes (Signed)
06/16/2014   PCP: Leanor RubensteinSUN,VYVYAN Y, MD   Chief Complaint  Patient presents with  . Follow-up    Some chest pressure    Primary Cardiologist:Dr. Erlene QuanJ. Berry   HPI:  53 y/o male who went to his primary care Danniel Tones in May with complaints of chest pain. He saw Dr Allyson SabalBerry and was set up to have an exercise Myoview-this pain is new onset and began 4 weeks prior to admit. It waxed and waned. It radiated to his throat and left upper extremity. His risk factors include hypertension and family history of CAD.  06/05/14 in our office for stress test- At peak exercise he had 10/10 chest pain with inf/lat TWI. This improved with NTG. He was seen by Dr. Tresa EndoKelly and admitted to the hospital. Cardiac enzymes were negative and he underwent  DES to 90% ulcerated RCA-proximal plaque, has old totaled LCX, mod LAD-FFR not hemodynamically significant. See cath report if recurrent chest pain.  Today is back for followup. He still has a lot of fatigue is able to walk for exercise. One day he did have some chest pressure that resolved quickly.  He does have trouble sleeping at night and wakes up at 3 AM discussed getting a reading and he may fall back asleep also he has a prescription for Xanax and we will refill he can take that at 3 AM and see if that doesn't help him get back to sleep.   BP slightly elevated today but at home he is 124/74.  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   No Known Allergies  Current Outpatient Prescriptions  Medication Sig Dispense Refill  . amLODipine (NORVASC) 5 MG tablet Take 1 tablet (5 mg total) by mouth daily.  180 tablet  3  . aspirin 81 MG tablet Take 81 mg by mouth daily.      [name]. atorvastatin (LIPITOR) 80 MG tablet Take 1 tablet (80 mg total) by mouth daily at 6 PM.  30 tablet  6  . clopidogrel (PLAVIX) 75 MG tablet Take 1 tablet (75 mg total) by mouth daily with breakfast.  30 tablet  11  . lisinopril (PRINIVIL,ZESTRIL) 20 MG tablet Take 1  tablet (20 mg total) by mouth daily.  30 tablet  6  . nitroGLYCERIN (NITROSTAT) 0.4 MG SL tablet Place 1 tablet (0.4 mg total) under the tongue every 5 (five) minutes x 3 doses as needed for chest pain.  25 tablet  4  . pantoprazole (PROTONIX) 40 MG tablet Take 1 tablet (40 mg total) by mouth daily.  30 tablet  11  . ALPRAZolam (XANAX) 0.25 MG tablet Take 1 tablet (0.25 mg total) by mouth daily as needed for anxiety.  30 tablet  0   No current facility-administered medications for this visit.    Past Medical History  Diagnosis Date  . Hypertension   . Chest pain   . Family history of heart disease   . Abnormal nuclear stress test 06/07/2014  . Hyperlipidemia LDL goal <70 06/07/2014  . CAD, multiple vessel, culprit RCA -prox with DES placed, total LCX-old, mod LAD- FFR not Hemodynamically sign.   06/07/2014    Past Surgical History  Procedure Laterality Date  . Coronary stent placement  06/06/14    DES to Prox RCA  . Cardiac catheterization      RUE:[id number]:XBROS:General:no colds or fevers, no weight changes Skin:no rashes or ulcers HEENT:no blurred vision, no  congestion CV:see HPI PUL:see HPI GI:no diarrhea constipation or melena, no indigestion GU:no hematuria, no dysuria MS:no joint pain, no claudication Neuro:no syncope, no lightheadedness Endo:no diabetes, no thyroid disease  Wt Readings from Last 3 Encounters:  06/16/14 193 lb (87.544 kg)  06/05/14 188 lb 0.8 oz (85.3 kg)  06/05/14 188 lb 0.8 oz (85.3 kg)    PHYSICAL EXAM BP 140/86  Ht 5\' 10"  (1.778 m)  Wt 193 lb (87.544 kg)  BMI 27.69 kg/m2 General:Pleasant affect, NAD Skin:Warm and dry, brisk capillary refill HEENT:normocephalic, sclera clear, mucus membranes moist Neck:supple, no JVD, no bruits  Heart:S1S2 RRR without murmur, gallup, rub or click Lungs:clear without rales, rhonchi, or wheezes GNF:AOZHAbd:soft, non tender, + BS, do not palpate liver spleen or masses Ext:no lower ext edema, 2+ pedal pulses, 2+ radial  pulses Neuro:alert and oriented, MAE, follows commands, + facial symmetry EKG:SR no acute changes  ASSESSMENT AND PLAN CAD, multiple vessel, culprit RCA -prox with DES placed, total LCX-old, mod LAD- FFR not Hemodynamically signficant 06/06/14  Stable with slow recovery.  Blood pressure and EKG stable one brief episode of chest discomfort but otherwise fatigue is his greatest complaint.  We'll keep him out of work for 4 more weeks to improve and have him see Dr. Allyson SabalBerry in 4 weeks.  Hyperlipidemia LDL goal <70 Now on statin and will need to be rechecked in 3-4 months  Hypertension Controlled

## 2014-06-23 NOTE — Telephone Encounter (Signed)
Closed encounter °

## 2014-06-26 NOTE — Telephone Encounter (Signed)
Encounter complete. 

## 2014-07-01 ENCOUNTER — Telehealth: Payer: Self-pay | Admitting: Cardiovascular Disease

## 2014-07-01 NOTE — Telephone Encounter (Signed)
Received FMLA form on 06/25/14 called patient to see when they could come in and fill out release form and the 25.00 dollar fee they said they would come in on 06/26/14 voiced that, that would be fine.  Placed in bag to [facility].  CR  Received form back on 06/27/14 [facility] Dr. Allyson SabalBerry to fill out. CR  On 07/01/14 [place] form back from [facility], made copy for my records waiting on bag to come [place] [facility] [place] to send this out for [facility].  CR

## 2014-07-15 ENCOUNTER — Ambulatory Visit (INDEPENDENT_AMBULATORY_CARE_PROVIDER_SITE_OTHER): Payer: BC Managed Care – PPO | Admitting: Cardiovascular Disease

## 2014-07-15 ENCOUNTER — Encounter: Payer: Self-pay | Admitting: Cardiovascular Disease

## 2014-07-15 VITALS — BP 104/66 | HR 72 | Ht 70.0 in | Wt 196.0 lb

## 2014-07-15 DIAGNOSIS — E782 Mixed hyperlipidemia: Secondary | ICD-10-CM

## 2014-07-15 DIAGNOSIS — I2510 Atherosclerotic heart disease of native coronary artery without angina pectoris: Secondary | ICD-10-CM

## 2014-07-15 DIAGNOSIS — I10 Essential (primary) hypertension: Secondary | ICD-10-CM

## 2014-07-15 DIAGNOSIS — E785 Hyperlipidemia, unspecified: Secondary | ICD-10-CM

## 2014-07-15 DIAGNOSIS — Z79899 Other long term (current) drug therapy: Secondary | ICD-10-CM

## 2014-07-15 NOTE — Assessment & Plan Note (Signed)
On statin therapy with recent LDL performed 06/06/14 of 133. Atorvastatin 80 mg daily which is begun on the day of his cardiac catheterization. I'm going to recheck a lipid profile in 4 weeks

## 2014-07-15 NOTE — Assessment & Plan Note (Signed)
Patient had a high-risk Myoview stress test on 06/05/14 which led to a cardiac catheterization the following day revealing three-vessel disease. Dr. Eldridge DaceVaranasi performed FFR of the LAD which showed an intermediate lesion that was not physiologically significant. The nondominant circumflex is occluded and a large dominant RCA had a high-grade lesion which was stented with a 3.5 x 18 [place] resolute drug-eluting stent. He is on Toprol and psychotherapy. The circumflex gives collaterals to the RCA. He no longer has ischemic symptoms. At this point, am inclined to treat the circumflex chronic total occlusion medically. Should he develop anginal symptoms recalcitrant to 2 antianginal medications he would be a candidate to undergo percutaneous revascularization.

## 2014-07-15 NOTE — Progress Notes (Signed)
07/15/2014 Ruthy Dickoger S Joshua   [birth year]  [id number]  Primary Physician Leanor RubensteinSUN,VYVYAN Y, MD Primary Cardiologist: Runell GessJonathan J. Axie Hayne MD Roseanne RenoFACP,FACC,FAHA, FSCAI   HPI:  Mr. Tyler Holder is a 53 year old appearing married Caucasian male father of 2 children, grandfather and one grandchild who does HVAC for a living. He is referred by his primary care physician for evaluation of chest pain. This pain is new onset and began 7-8 weeks ago. It waxes and wanes. It radiated to his throat and left upper extremity. His cardiac risk factors include hypertension and family history (father died of myocardial infarction in his early 6560s). He had a Myoview stress test performed/18/15 that was high risk and underwent cardiac catheterization the following day by Dr. Eldridge DaceVaranasi revealing three-vessel disease. His LAD was intermediate in severity and was not be significant by FFR. The circumflex coronary artery was nondominant and chronically occluded. The RCA was dominant and had a high-grade lesion which was stented with a drug-eluting stent (3.5 mm x 18 mm long resolute). The circumflex did receive collaterals from the RCA. He Doebler has been ischemic symptoms which brought him to me initially.    Current Outpatient Prescriptions  Medication Sig Dispense Refill  . ALPRAZolam (XANAX) 0.25 MG tablet Take 1 tablet (0.25 mg total) by mouth daily as needed for anxiety.  30 tablet  0  . amLODipine (NORVASC) 5 MG tablet Take 1 tablet (5 mg total) by mouth daily.  180 tablet  3  . aspirin 81 MG tablet Take 81 mg by mouth daily.      [name]. atorvastatin (LIPITOR) 80 MG tablet Take 1 tablet (80 mg total) by mouth daily at 6 PM.  30 tablet  6  . clopidogrel (PLAVIX) 75 MG tablet Take 1 tablet (75 mg total) by mouth daily with breakfast.  30 tablet  11  . lisinopril (PRINIVIL,ZESTRIL) 20 MG tablet Take 1 tablet (20 mg total) by mouth daily.  30 tablet  6  . nitroGLYCERIN (NITROSTAT) 0.4 MG SL tablet Place 1 tablet (0.4 mg total) under the  tongue every 5 (five) minutes x 3 doses as needed for chest pain.  25 tablet  4  . pantoprazole (PROTONIX) 40 MG tablet Take 1 tablet (40 mg total) by mouth daily.  30 tablet  11   No current facility-administered medications for this visit.    No Known Allergies  History   Social History  . Marital Status: Married    Spouse Name: N/A    Number of Children: N/A  . Years of Education: N/A   Occupational History  . Not on file.   Social History Main Topics  . Smoking status: Never Smoker   . Smokeless tobacco: Not on file  . Alcohol Use: No  . Drug Use: No  . Sexual Activity: Not on file   Other Topics Concern  . Not on file   Social History Narrative  . No narrative on file     Review of Systems: General: negative for chills, fever, night sweats or weight changes.  Cardiovascular: negative for chest pain, dyspnea on exertion, edema, orthopnea, palpitations, paroxysmal nocturnal dyspnea or shortness of breath Dermatological: negative for rash Respiratory: negative for cough or wheezing Urologic: negative for hematuria Abdominal: negative for nausea, vomiting, diarrhea, bright red blood per rectum, melena, or hematemesis Neurologic: negative for visual changes, syncope, or dizziness All other systems reviewed and are otherwise negative except as noted above.    Blood pressure 104/66, pulse 72, height 5\' 10"  (  1.778 m), weight 196 lb (88.905 kg).  General appearance: alert and no distress Neck: no adenopathy, no carotid bruit, no JVD, supple, symmetrical, trachea midline and thyroid not enlarged, symmetric, no tenderness/mass/nodules Lungs: clear to auscultation bilaterally Heart: regular rate and rhythm, S1, S2 normal, no murmur, click, rub or gallop Extremities: extremities normal, atraumatic, no cyanosis or edema and the right radial arterial puncture site is well-healed  EKG not performed today  ASSESSMENT AND PLAN:   Hyperlipidemia LDL goal <70 On statin  therapy with recent LDL performed 06/06/14 of 133. Atorvastatin 80 mg daily which is begun on the day of his cardiac catheterization. I'm going to recheck a lipid profile in 4 weeks  Essential hypertension Controlled on current medications  CAD, multiple vessel, culprit RCA -prox with DES placed, total LCX-old, mod LAD- FFR not Hemodynamically signficant 06/06/14  Patient had a high-risk Myoview stress test on 06/05/14 which led to a cardiac catheterization the following day revealing three-vessel disease. Dr. Eldridge Dace performed FFR of the LAD which showed an intermediate lesion that was not physiologically significant. The nondominant circumflex is occluded and a large dominant RCA had a high-grade lesion which was stented with a 3.5 x 18 [place] resolute drug-eluting stent. He is on Toprol and psychotherapy. The circumflex gives collaterals to the RCA. He no longer has ischemic symptoms. At this point, am inclined to treat the circumflex chronic total occlusion medically. Should he develop anginal symptoms recalcitrant to 2 antianginal medications he would be a candidate to undergo percutaneous revascularization.      Runell Gess MD FACP,FACC,FAHA, [facility] 07/15/2014 9:05 AM

## 2014-07-15 NOTE — Assessment & Plan Note (Signed)
Controlled on current medications 

## 2014-07-15 NOTE — Patient Instructions (Addendum)
Dr Allyson SabalBerry has ordered you to have blood work done IN ONE MONTH. (Lipid, Liver)  Your physician recommends that you schedule a follow-up appointment in 6 months with an extender - Nada BoozerLaura Ingold, NP.  Dr Allyson SabalBerry wants you to follow-up in 1 year. You will receive a reminder letter in the mail two months in advance. If you don't receive a letter, please call our office to schedule the follow-up appointment.

## 2014-07-17 ENCOUNTER — Encounter (HOSPITAL_COMMUNITY)
Admission: RE | Admit: 2014-07-17 | Discharge: 2014-07-17 | Disposition: A | Discharge status: Home / Self Care | Payer: BC Managed Care – PPO | Source: Ambulatory Visit | Attending: Cardiovascular Disease | Admitting: Cardiovascular Disease

## 2014-07-17 NOTE — Progress Notes (Signed)
Cardiac Rehab Medication Review by a Pharmacist  Does the patient  feel that his/her medications are working for him/her?  yes  Has the patient been experiencing any side effects to the medications prescribed?  no  Does the patient measure his/her own blood pressure or blood glucose at home?  yes   Does the patient have any problems obtaining medications due to transportation or finances?   no  Understanding of regimen: excellent Understanding of indications: excellent Potential of compliance: excellent    Pharmacist comments: Pt has a strong understanding of medication and indications for use.  His wife and daughter are involved in his care and ensuring good compliance.  Pt reports some fatigue and headaches possibly associated with his medications.  Counseling for SL NTG completed and questions answered.   Waynette Butteryegan K. Breion Novacek, PharmD Clinical Pharmacy Resident Pager: [phone] 07/17/2014 9:04 AM

## 2014-07-21 ENCOUNTER — Encounter (HOSPITAL_COMMUNITY)
Admission: RE | Admit: 2014-07-21 | Discharge: 2014-07-21 | Disposition: A | Discharge status: Home / Self Care | Payer: BC Managed Care – PPO | Source: Ambulatory Visit | Attending: Cardiovascular Disease | Admitting: Cardiovascular Disease

## 2014-07-21 DIAGNOSIS — Z9861 Coronary angioplasty status: Secondary | ICD-10-CM | POA: Insufficient documentation

## 2014-07-21 DIAGNOSIS — Z5189 Encounter for other specified aftercare: Secondary | ICD-10-CM | POA: Insufficient documentation

## 2014-07-21 DIAGNOSIS — I2510 Atherosclerotic heart disease of native coronary artery without angina pectoris: Secondary | ICD-10-CM | POA: Diagnosis not present

## 2014-07-21 NOTE — Progress Notes (Signed)
Pt in today for his first day of exercise.  Pt tolerated light exercise with no complaints.  Monitor showed SR with negative QRS with no noted ectopy.  Pt concerned about returning back to work on tomorrow since his job involves working outside in the elements.  Pt also works for himself on the side.  Pt plans to talk to his employer about arriving late to work on days he attends cardiac rehab.  Pt concerns are further validated with his quality of life score regarding socioeconomic.  PHQ2 score 1.  Pt is hopeful that this will improve with coming to rehab.  Pt with supportive family. Goals for pt to increase strength  And know how hard to push.  Will be able to cover this with pt during home exercise review.  Long term is to lift weights again, increase muscle mass and not be timid.  Will continue to monitor pt progress toward meeting this goal. Karlene Linemanarlette Carlton RN, BSN

## 2014-07-23 ENCOUNTER — Encounter (HOSPITAL_COMMUNITY)
Admission: RE | Admit: 2014-07-23 | Discharge: 2014-07-23 | Disposition: A | Discharge status: Home / Self Care | Payer: BC Managed Care – PPO | Source: Ambulatory Visit | Attending: Cardiovascular Disease | Admitting: Cardiovascular Disease

## 2014-07-23 DIAGNOSIS — Z5189 Encounter for other specified aftercare: Secondary | ICD-10-CM | POA: Diagnosis not present

## 2014-07-25 ENCOUNTER — Encounter (HOSPITAL_COMMUNITY)
Admission: RE | Admit: 2014-07-25 | Discharge: 2014-07-25 | Disposition: A | Discharge status: Home / Self Care | Payer: BC Managed Care – PPO | Source: Ambulatory Visit | Attending: Cardiovascular Disease | Admitting: Cardiovascular Disease

## 2014-07-25 DIAGNOSIS — Z5189 Encounter for other specified aftercare: Secondary | ICD-10-CM | POA: Diagnosis not present

## 2014-07-28 ENCOUNTER — Encounter (HOSPITAL_COMMUNITY): Payer: BC Managed Care – PPO

## 2014-07-30 ENCOUNTER — Encounter (HOSPITAL_COMMUNITY)
Admission: RE | Admit: 2014-07-30 | Discharge: 2014-07-30 | Disposition: A | Discharge status: Home / Self Care | Payer: BC Managed Care – PPO | Source: Ambulatory Visit | Attending: Cardiovascular Disease | Admitting: Cardiovascular Disease

## 2014-07-30 DIAGNOSIS — Z5189 Encounter for other specified aftercare: Secondary | ICD-10-CM | POA: Diagnosis not present

## 2014-08-01 ENCOUNTER — Encounter (HOSPITAL_COMMUNITY)
Admission: RE | Admit: 2014-08-01 | Discharge: 2014-08-01 | Disposition: A | Discharge status: Home / Self Care | Payer: BC Managed Care – PPO | Source: Ambulatory Visit | Attending: Cardiovascular Disease | Admitting: Cardiovascular Disease

## 2014-08-01 DIAGNOSIS — Z5189 Encounter for other specified aftercare: Secondary | ICD-10-CM | POA: Diagnosis not present

## 2014-08-04 ENCOUNTER — Encounter (HOSPITAL_COMMUNITY)
Admission: RE | Admit: 2014-08-04 | Discharge: 2014-08-04 | Disposition: A | Discharge status: Home / Self Care | Payer: BC Managed Care – PPO | Source: Ambulatory Visit | Attending: Cardiovascular Disease | Admitting: Cardiovascular Disease

## 2014-08-04 DIAGNOSIS — Z5189 Encounter for other specified aftercare: Secondary | ICD-10-CM | POA: Diagnosis not present

## 2014-08-06 ENCOUNTER — Encounter (HOSPITAL_COMMUNITY)
Admission: RE | Admit: 2014-08-06 | Discharge: 2014-08-06 | Disposition: A | Discharge status: Home / Self Care | Payer: BC Managed Care – PPO | Source: Ambulatory Visit | Attending: Cardiovascular Disease | Admitting: Cardiovascular Disease

## 2014-08-06 DIAGNOSIS — Z5189 Encounter for other specified aftercare: Secondary | ICD-10-CM | POA: Diagnosis not present

## 2014-08-07 ENCOUNTER — Encounter: Payer: Self-pay | Admitting: Cardiovascular Disease

## 2014-08-08 ENCOUNTER — Encounter (HOSPITAL_COMMUNITY)
Admission: RE | Admit: 2014-08-08 | Discharge: 2014-08-08 | Disposition: A | Discharge status: Home / Self Care | Payer: BC Managed Care – PPO | Source: Ambulatory Visit | Attending: Cardiovascular Disease | Admitting: Cardiovascular Disease

## 2014-08-08 DIAGNOSIS — Z5189 Encounter for other specified aftercare: Secondary | ICD-10-CM | POA: Diagnosis not present

## 2014-08-08 NOTE — Progress Notes (Signed)
Tyler Holder 53 y.o. male Nutrition Note Spoke with pt.  Nutrition Survey reviewed with pt. Pt is following Step 1 of the Therapeutic Lifestyle Changes diet. Pt expressed understanding of the information reviewed. Pt aware of nutrition education classes offered.  Nutrition Diagnosis   Food-and nutrition-related knowledge deficit related to lack of exposure to information as related to diagnosis of: ? CVD ?   Nutrition Intervention   Benefits of adopting Therapeutic Lifestyle Changes discussed when Medficts reviewed.   Pt to attend the Portion Distortion class   Pt to attend the  ? Nutrition I class - met 07/22/14                    ? Nutrition II class - met 07/29/14   Continue client-centered nutrition education by RD, as part of interdisciplinary care.  Goal(s)   Pt to identify and limit food sources of saturated fat, trans fat, and cholesterol  Monitor and Evaluate progress toward nutrition goal with team. Nutrition Risk:  Low   Derek Mound, M.Ed, RD, LDN, CDE 08/08/2014 8:16 AM

## 2014-08-08 NOTE — Progress Notes (Signed)
I have reviewed home exercise with Tyler Holder. The patient was advised to walk 2-4 days per week outside of CRP II for 30 minutes continuously.  Pt will also complete one additional day of hand weights outside of CRP II.  Progression of exercise prescription was discussed.  Reviewed THR, pulse, RPE, sign and symptoms, NTG use and when to call 911 or MD.  Pt voiced understanding.  Alexia FreestoneKendall Aldene Hendon, MS, ACSM RCEP 08/08/2014 3:58 PM

## 2014-08-11 ENCOUNTER — Encounter (HOSPITAL_COMMUNITY)
Admission: RE | Admit: 2014-08-11 | Discharge: 2014-08-11 | Disposition: A | Discharge status: Home / Self Care | Payer: BC Managed Care – PPO | Source: Ambulatory Visit | Attending: Cardiovascular Disease | Admitting: Cardiovascular Disease

## 2014-08-11 DIAGNOSIS — Z5189 Encounter for other specified aftercare: Secondary | ICD-10-CM | POA: Diagnosis not present

## 2014-08-13 ENCOUNTER — Encounter (HOSPITAL_COMMUNITY)
Admission: RE | Admit: 2014-08-13 | Discharge: 2014-08-13 | Disposition: A | Discharge status: Home / Self Care | Payer: BC Managed Care – PPO | Source: Ambulatory Visit | Attending: Cardiovascular Disease | Admitting: Cardiovascular Disease

## 2014-08-13 DIAGNOSIS — Z5189 Encounter for other specified aftercare: Secondary | ICD-10-CM | POA: Diagnosis not present

## 2014-08-14 ENCOUNTER — Telehealth: Payer: Self-pay | Admitting: *Deleted

## 2014-08-14 NOTE — Telephone Encounter (Signed)
I received a fax from cardiac rehab about Tyler Holder having some chest "awareness" during rehab.  I called patient and offered him an appt with an APP 8/28, but he declined .  He said that he thought he pushed himself too hard and does not feel like he needs to come in.  I advised him to please call if the symptoms reoccur or change in any way.  He voiced understanding.

## 2014-08-15 ENCOUNTER — Encounter (HOSPITAL_COMMUNITY)
Admission: RE | Admit: 2014-08-15 | Discharge: 2014-08-15 | Disposition: A | Discharge status: Home / Self Care | Payer: BC Managed Care – PPO | Source: Ambulatory Visit | Attending: Cardiovascular Disease | Admitting: Cardiovascular Disease

## 2014-08-15 DIAGNOSIS — Z5189 Encounter for other specified aftercare: Secondary | ICD-10-CM | POA: Diagnosis not present

## 2014-08-18 ENCOUNTER — Encounter (HOSPITAL_COMMUNITY)
Admission: RE | Admit: 2014-08-18 | Discharge: 2014-08-18 | Disposition: A | Discharge status: Home / Self Care | Payer: BC Managed Care – PPO | Source: Ambulatory Visit | Attending: Cardiovascular Disease | Admitting: Cardiovascular Disease

## 2014-08-18 DIAGNOSIS — Z5189 Encounter for other specified aftercare: Secondary | ICD-10-CM | POA: Diagnosis not present

## 2014-08-20 ENCOUNTER — Encounter (HOSPITAL_COMMUNITY)
Admission: RE | Admit: 2014-08-20 | Discharge: 2014-08-20 | Disposition: A | Discharge status: Home / Self Care | Payer: BC Managed Care – PPO | Source: Ambulatory Visit | Attending: Cardiovascular Disease | Admitting: Cardiovascular Disease

## 2014-08-20 DIAGNOSIS — I2510 Atherosclerotic heart disease of native coronary artery without angina pectoris: Secondary | ICD-10-CM | POA: Insufficient documentation

## 2014-08-20 DIAGNOSIS — Z5189 Encounter for other specified aftercare: Secondary | ICD-10-CM | POA: Insufficient documentation

## 2014-08-20 DIAGNOSIS — Z9861 Coronary angioplasty status: Secondary | ICD-10-CM | POA: Diagnosis not present

## 2014-08-22 ENCOUNTER — Encounter (HOSPITAL_COMMUNITY)
Admission: RE | Admit: 2014-08-22 | Discharge: 2014-08-22 | Disposition: A | Discharge status: Home / Self Care | Payer: BC Managed Care – PPO | Source: Ambulatory Visit | Attending: Cardiovascular Disease | Admitting: Cardiovascular Disease

## 2014-08-22 DIAGNOSIS — Z5189 Encounter for other specified aftercare: Secondary | ICD-10-CM | POA: Diagnosis not present

## 2014-08-25 ENCOUNTER — Encounter (HOSPITAL_COMMUNITY): Payer: BC Managed Care – PPO

## 2014-08-27 ENCOUNTER — Encounter (HOSPITAL_COMMUNITY): Payer: BC Managed Care – PPO

## 2014-08-29 ENCOUNTER — Encounter (HOSPITAL_COMMUNITY)
Admission: RE | Admit: 2014-08-29 | Discharge: 2014-08-29 | Disposition: A | Discharge status: Home / Self Care | Payer: BC Managed Care – PPO | Source: Ambulatory Visit | Attending: Cardiovascular Disease | Admitting: Cardiovascular Disease

## 2014-08-29 DIAGNOSIS — Z5189 Encounter for other specified aftercare: Secondary | ICD-10-CM | POA: Diagnosis not present

## 2014-09-01 ENCOUNTER — Encounter (HOSPITAL_COMMUNITY)
Admission: RE | Admit: 2014-09-01 | Discharge: 2014-09-01 | Disposition: A | Discharge status: Home / Self Care | Payer: BC Managed Care – PPO | Source: Ambulatory Visit | Attending: Cardiovascular Disease | Admitting: Cardiovascular Disease

## 2014-09-01 DIAGNOSIS — Z5189 Encounter for other specified aftercare: Secondary | ICD-10-CM | POA: Diagnosis not present

## 2014-09-03 ENCOUNTER — Encounter (HOSPITAL_COMMUNITY)
Admission: RE | Admit: 2014-09-03 | Discharge: 2014-09-03 | Disposition: A | Discharge status: Home / Self Care | Payer: BC Managed Care – PPO | Source: Ambulatory Visit | Attending: Cardiovascular Disease | Admitting: Cardiovascular Disease

## 2014-09-03 DIAGNOSIS — Z5189 Encounter for other specified aftercare: Secondary | ICD-10-CM | POA: Diagnosis not present

## 2014-09-05 ENCOUNTER — Encounter (HOSPITAL_COMMUNITY)
Admission: RE | Admit: 2014-09-05 | Discharge: 2014-09-05 | Disposition: A | Discharge status: Home / Self Care | Payer: BC Managed Care – PPO | Source: Ambulatory Visit | Attending: Cardiovascular Disease | Admitting: Cardiovascular Disease

## 2014-09-05 DIAGNOSIS — Z5189 Encounter for other specified aftercare: Secondary | ICD-10-CM | POA: Diagnosis not present

## 2014-09-08 ENCOUNTER — Encounter (HOSPITAL_COMMUNITY)
Admission: RE | Admit: 2014-09-08 | Discharge: 2014-09-08 | Disposition: A | Discharge status: Home / Self Care | Payer: BC Managed Care – PPO | Source: Ambulatory Visit | Attending: Cardiovascular Disease | Admitting: Cardiovascular Disease

## 2014-09-08 DIAGNOSIS — Z5189 Encounter for other specified aftercare: Secondary | ICD-10-CM | POA: Diagnosis not present

## 2014-09-10 ENCOUNTER — Encounter (HOSPITAL_COMMUNITY)
Admission: RE | Admit: 2014-09-10 | Discharge: 2014-09-10 | Disposition: A | Discharge status: Home / Self Care | Payer: BC Managed Care – PPO | Source: Ambulatory Visit | Attending: Cardiovascular Disease | Admitting: Cardiovascular Disease

## 2014-09-10 DIAGNOSIS — Z5189 Encounter for other specified aftercare: Secondary | ICD-10-CM | POA: Diagnosis not present

## 2014-09-12 ENCOUNTER — Encounter (HOSPITAL_COMMUNITY)
Admission: RE | Admit: 2014-09-12 | Discharge: 2014-09-12 | Disposition: A | Discharge status: Home / Self Care | Payer: BC Managed Care – PPO | Source: Ambulatory Visit | Attending: Cardiovascular Disease | Admitting: Cardiovascular Disease

## 2014-09-12 DIAGNOSIS — Z5189 Encounter for other specified aftercare: Secondary | ICD-10-CM | POA: Diagnosis not present

## 2014-09-15 ENCOUNTER — Encounter (HOSPITAL_COMMUNITY)
Admission: RE | Admit: 2014-09-15 | Discharge: 2014-09-15 | Disposition: A | Discharge status: Home / Self Care | Payer: BC Managed Care – PPO | Source: Ambulatory Visit | Attending: Cardiovascular Disease | Admitting: Cardiovascular Disease

## 2014-09-15 DIAGNOSIS — Z5189 Encounter for other specified aftercare: Secondary | ICD-10-CM | POA: Diagnosis not present

## 2014-09-17 ENCOUNTER — Encounter (HOSPITAL_COMMUNITY)
Admission: RE | Admit: 2014-09-17 | Discharge: 2014-09-17 | Disposition: A | Discharge status: Home / Self Care | Payer: BC Managed Care – PPO | Source: Ambulatory Visit | Attending: Cardiovascular Disease | Admitting: Cardiovascular Disease

## 2014-09-17 DIAGNOSIS — Z5189 Encounter for other specified aftercare: Secondary | ICD-10-CM | POA: Diagnosis not present

## 2014-09-19 ENCOUNTER — Encounter (HOSPITAL_COMMUNITY)
Admission: RE | Admit: 2014-09-19 | Discharge: 2014-09-19 | Disposition: A | Discharge status: Home / Self Care | Payer: BC Managed Care – PPO | Source: Ambulatory Visit | Attending: Cardiovascular Disease | Admitting: Cardiovascular Disease

## 2014-09-19 DIAGNOSIS — I2510 Atherosclerotic heart disease of native coronary artery without angina pectoris: Secondary | ICD-10-CM | POA: Insufficient documentation

## 2014-09-19 DIAGNOSIS — Z955 Presence of coronary angioplasty implant and graft: Secondary | ICD-10-CM | POA: Insufficient documentation

## 2014-09-22 ENCOUNTER — Encounter (HOSPITAL_COMMUNITY): Payer: BC Managed Care – PPO

## 2014-09-24 ENCOUNTER — Encounter (HOSPITAL_COMMUNITY)
Admission: RE | Admit: 2014-09-24 | Discharge: 2014-09-24 | Disposition: A | Discharge status: Home / Self Care | Payer: BC Managed Care – PPO | Source: Ambulatory Visit | Attending: Cardiovascular Disease | Admitting: Cardiovascular Disease

## 2014-09-24 DIAGNOSIS — I2510 Atherosclerotic heart disease of native coronary artery without angina pectoris: Secondary | ICD-10-CM | POA: Diagnosis not present

## 2014-09-26 ENCOUNTER — Encounter (HOSPITAL_COMMUNITY)
Admission: RE | Admit: 2014-09-26 | Discharge: 2014-09-26 | Disposition: A | Discharge status: Home / Self Care | Payer: BC Managed Care – PPO | Source: Ambulatory Visit | Attending: Cardiovascular Disease | Admitting: Cardiovascular Disease

## 2014-09-26 DIAGNOSIS — I2510 Atherosclerotic heart disease of native coronary artery without angina pectoris: Secondary | ICD-10-CM | POA: Diagnosis not present

## 2014-09-29 ENCOUNTER — Encounter (HOSPITAL_COMMUNITY)
Admission: RE | Admit: 2014-09-29 | Discharge: 2014-09-29 | Disposition: A | Discharge status: Home / Self Care | Payer: BC Managed Care – PPO | Source: Ambulatory Visit | Attending: Cardiovascular Disease | Admitting: Cardiovascular Disease

## 2014-09-29 DIAGNOSIS — I2510 Atherosclerotic heart disease of native coronary artery without angina pectoris: Secondary | ICD-10-CM | POA: Diagnosis not present

## 2014-10-01 ENCOUNTER — Encounter (HOSPITAL_COMMUNITY)
Admission: RE | Admit: 2014-10-01 | Discharge: 2014-10-01 | Disposition: A | Discharge status: Home / Self Care | Payer: BC Managed Care – PPO | Source: Ambulatory Visit | Attending: Cardiovascular Disease | Admitting: Cardiovascular Disease

## 2014-10-01 DIAGNOSIS — I2510 Atherosclerotic heart disease of native coronary artery without angina pectoris: Secondary | ICD-10-CM | POA: Diagnosis not present

## 2014-10-03 ENCOUNTER — Encounter (HOSPITAL_COMMUNITY)
Admission: RE | Admit: 2014-10-03 | Discharge: 2014-10-03 | Disposition: A | Discharge status: Home / Self Care | Payer: BC Managed Care – PPO | Source: Ambulatory Visit | Attending: Cardiovascular Disease | Admitting: Cardiovascular Disease

## 2014-10-03 DIAGNOSIS — I2510 Atherosclerotic heart disease of native coronary artery without angina pectoris: Secondary | ICD-10-CM | POA: Diagnosis not present

## 2014-10-06 ENCOUNTER — Encounter (HOSPITAL_COMMUNITY): Payer: BC Managed Care – PPO

## 2014-10-08 ENCOUNTER — Encounter (HOSPITAL_COMMUNITY)
Admission: RE | Admit: 2014-10-08 | Discharge: 2014-10-08 | Disposition: A | Discharge status: Home / Self Care | Payer: BC Managed Care – PPO | Source: Ambulatory Visit | Attending: Cardiovascular Disease | Admitting: Cardiovascular Disease

## 2014-10-08 DIAGNOSIS — I2510 Atherosclerotic heart disease of native coronary artery without angina pectoris: Secondary | ICD-10-CM | POA: Diagnosis not present

## 2014-10-10 ENCOUNTER — Encounter (HOSPITAL_COMMUNITY)
Admission: RE | Admit: 2014-10-10 | Discharge: 2014-10-10 | Disposition: A | Discharge status: Home / Self Care | Payer: BC Managed Care – PPO | Source: Ambulatory Visit | Attending: Cardiovascular Disease | Admitting: Cardiovascular Disease

## 2014-10-10 DIAGNOSIS — I2510 Atherosclerotic heart disease of native coronary artery without angina pectoris: Secondary | ICD-10-CM | POA: Diagnosis not present

## 2014-10-13 ENCOUNTER — Encounter (HOSPITAL_COMMUNITY)
Admission: RE | Admit: 2014-10-13 | Discharge: 2014-10-13 | Disposition: A | Discharge status: Home / Self Care | Payer: BC Managed Care – PPO | Source: Ambulatory Visit | Attending: Cardiovascular Disease | Admitting: Cardiovascular Disease

## 2014-10-13 DIAGNOSIS — I2510 Atherosclerotic heart disease of native coronary artery without angina pectoris: Secondary | ICD-10-CM | POA: Diagnosis not present

## 2014-10-15 ENCOUNTER — Encounter (HOSPITAL_COMMUNITY)
Admission: RE | Admit: 2014-10-15 | Discharge: 2014-10-15 | Disposition: A | Discharge status: Home / Self Care | Payer: BC Managed Care – PPO | Source: Ambulatory Visit | Attending: Cardiovascular Disease | Admitting: Cardiovascular Disease

## 2014-10-15 DIAGNOSIS — I2510 Atherosclerotic heart disease of native coronary artery without angina pectoris: Secondary | ICD-10-CM | POA: Diagnosis not present

## 2014-10-17 ENCOUNTER — Encounter (HOSPITAL_COMMUNITY)
Admission: RE | Admit: 2014-10-17 | Discharge: 2014-10-17 | Disposition: A | Discharge status: Home / Self Care | Payer: BC Managed Care – PPO | Source: Ambulatory Visit | Attending: Cardiovascular Disease | Admitting: Cardiovascular Disease

## 2014-10-17 DIAGNOSIS — I2510 Atherosclerotic heart disease of native coronary artery without angina pectoris: Secondary | ICD-10-CM | POA: Diagnosis not present

## 2014-10-20 ENCOUNTER — Encounter (HOSPITAL_COMMUNITY): Payer: BC Managed Care – PPO

## 2014-10-22 ENCOUNTER — Encounter (HOSPITAL_COMMUNITY)
Admission: RE | Admit: 2014-10-22 | Discharge: 2014-10-22 | Disposition: A | Discharge status: Home / Self Care | Payer: BC Managed Care – PPO | Source: Ambulatory Visit | Attending: Cardiovascular Disease | Admitting: Cardiovascular Disease

## 2014-10-22 DIAGNOSIS — Z955 Presence of coronary angioplasty implant and graft: Secondary | ICD-10-CM | POA: Diagnosis not present

## 2014-10-22 DIAGNOSIS — I2510 Atherosclerotic heart disease of native coronary artery without angina pectoris: Secondary | ICD-10-CM | POA: Diagnosis present

## 2014-10-22 NOTE — Progress Notes (Addendum)
Pt graduates today from the cardiac rehab phase II program with the completion of 35 exercise classes.  Pt elected to graduate early due to going out of town on Friday.  Pt made excellent progress in the program as evident by his increase in MET level from 2.9 to 3.9.  Pt with excellent attendance for exercise and for education classes.  Pt met his short tem goal increase strength back and know how far to push.  Pt feels he has a good understanding of what his limits are.  Pt plans to exercise at [employer] with aiming for 3-4 days a week. Long term goal is to lift weights.  Pt given instructions of how much weight to lift.  Pt verbalized that he would work up to that amount of weight.  Pt instructed on breathing technique during weight lifting.  Pt repeat PHQ2 score remained 1.  Pt has prn xanax for anxiety but seldomly needs to use it. Pt was a delight to work with during rehab and served as an encouragement to other participants. Cherre Huger, BSN

## 2014-10-24 ENCOUNTER — Encounter (HOSPITAL_COMMUNITY): Payer: BC Managed Care – PPO

## 2014-11-07 ENCOUNTER — Other Ambulatory Visit: Payer: Self-pay | Admitting: Cardiovascular Disease

## 2014-11-07 MED ORDER — LISINOPRIL 20 MG PO TABS: 20.0000 mg | ORAL_TABLET | Freq: Every day | ORAL | Status: DC

## 2014-11-07 NOTE — Telephone Encounter (Signed)
E-sent medication  To pharmacy  left message on voice mail .

## 2014-11-07 NOTE — Telephone Encounter (Signed)
Pt need more of his Lisinopril called in. He is going out of town and wants to be sure he will have enough. Please call to [phone].

## 2014-11-27 ENCOUNTER — Encounter (HOSPITAL_COMMUNITY): Payer: Self-pay | Admitting: Interventional Cardiology

## 2014-12-28 ENCOUNTER — Other Ambulatory Visit: Payer: Self-pay | Admitting: Cardiology

## 2014-12-30 NOTE — Telephone Encounter (Signed)
Rx(s) sent to pharmacy electronically.  

## 2015-01-16 ENCOUNTER — Other Ambulatory Visit: Payer: Self-pay | Admitting: Cardiovascular Disease

## 2015-01-16 MED ORDER — LISINOPRIL 20 MG PO TABS: 20.0000 mg | ORAL_TABLET | Freq: Every day | ORAL | Status: DC

## 2015-01-16 NOTE — Telephone Encounter (Signed)
Pt need a new prescription for his Lisinopril.Please call to [phone].

## 2015-01-16 NOTE — Telephone Encounter (Signed)
Refill submitted to pt's pharmacy of preference.

## 2015-02-03 ENCOUNTER — Ambulatory Visit: Payer: Self-pay | Admitting: Cardiology

## 2015-02-25 ENCOUNTER — Ambulatory Visit (INDEPENDENT_AMBULATORY_CARE_PROVIDER_SITE_OTHER): Payer: BLUE CROSS/BLUE SHIELD | Admitting: Cardiology

## 2015-02-25 ENCOUNTER — Encounter: Payer: Self-pay | Admitting: Cardiology

## 2015-02-25 VITALS — BP 108/70 | HR 57 | Ht 69.0 in | Wt 199.8 lb

## 2015-02-25 DIAGNOSIS — R0789 Other chest pain: Secondary | ICD-10-CM

## 2015-02-25 DIAGNOSIS — E785 Hyperlipidemia, unspecified: Secondary | ICD-10-CM

## 2015-02-25 DIAGNOSIS — Z79899 Other long term (current) drug therapy: Secondary | ICD-10-CM

## 2015-02-25 MED ORDER — VALSARTAN 160 MG PO TABS: 160.0000 mg | ORAL_TABLET | Freq: Every day | ORAL | Status: DC

## 2015-02-25 NOTE — Patient Instructions (Signed)
STOP Lisinopril.  START Valsartan 160mg  daily.  Your physician has requested that you have en exercise stress myoview. For further information please visit [email or web address]. Please follow instruction sheet, as given. We will call you with the results.  Your physician recommends that you schedule a follow-up appointment in: 6 months with Dr. Allyson SabalBerry.

## 2015-02-25 NOTE — Progress Notes (Signed)
Cardiology Office Note   Date:  02/26/2015   ID:  ZYRELL CARMEAN, DOB 08/21/1961, MRN [id number]  PCP:  Leanor Rubenstein, MD  Cardiologist:  Dr. Erlene Quan     Chief Complaint  Patient presents with  . Coronary Artery Disease    6 mo. no complaints      History of Present Illness: Tyler Holder is a 54 y.o. male who presents for his CAD and HTN.   His cardiac risk factors include hypertension and family history (father died of myocardial infarction in his early 75s). He had a Myoview stress test performed/18/15 that was high risk and underwent cardiac catheterization the following day by Dr. Eldridge Dace revealing three-vessel disease. His LAD was intermediate in severity and was not be significant by FFR. The circumflex coronary artery was nondominant and chronically occluded. The RCA was dominant and had a high-grade lesion which was stented with a drug-eluting stent (3.5 mm x 18 mm long resolute). The circumflex did receive collaterals from the RCA. He Doebler has been ischemic symptoms which brought him to me initially.  When asked about chest pain, he admits to chest tightness, heaviness usually at work but has occurred at end of his work outs.  No associated symptoms and they resolve with rest.  He also complains of severe cough with his ACE.  He is exercising and eating well-healthy.     Past Medical History  Diagnosis Date  . Hypertension   . Chest pain   . Family history of heart disease   . Abnormal nuclear stress test 06/07/2014  . Hyperlipidemia LDL goal <70 06/07/2014  . CAD, multiple vessel, culprit RCA -prox with DES placed, total LCX-old, mod LAD- FFR not Hemodynamically sign.   06/07/2014    Past Surgical History  Procedure Laterality Date  . Coronary stent placement  06/06/14    DES to Prox RCA  . Cardiac catheterization    . Left heart catheterization with coronary angiogram N/A 06/06/2014    Procedure: LEFT HEART CATHETERIZATION WITH CORONARY ANGIOGRAM;  Surgeon: Corky Crafts, MD;  Location: [facility] CATH LAB;  Service: Cardiovascular;  Laterality: N/A;  . Percutaneous coronary stent intervention (pci-s)  06/06/2014    Procedure: PERCUTANEOUS CORONARY STENT INTERVENTION (PCI-S);  Surgeon: Corky Crafts, MD;  Location: [facility] CATH LAB;  Service: Cardiovascular;;     Current Outpatient Prescriptions  Medication Sig Dispense Refill  . ALPRAZolam (XANAX) 0.25 MG tablet Take 1 tablet (0.25 mg total) by mouth daily as needed for anxiety. 30 tablet 0  . amLODipine (NORVASC) 5 MG tablet Take 1 tablet (5 mg total) by mouth daily. 180 tablet 3  . aspirin 81 MG tablet Take 81 mg by mouth daily.    [name] atorvastatin (LIPITOR) 80 MG tablet TAKE 1 TABLET BY MOUTH DAILY AT 6 PM 30 tablet 6  . clopidogrel (PLAVIX) 75 MG tablet Take 1 tablet (75 mg total) by mouth daily with breakfast. 30 tablet 11  . nitroGLYCERIN (NITROSTAT) 0.4 MG SL tablet Place 1 tablet (0.4 mg total) under the tongue every 5 (five) minutes x 3 doses as needed for chest pain. 25 tablet 4  . valsartan (DIOVAN) 160 MG tablet Take 1 tablet (160 mg total) by mouth daily. 30 tablet 6   No current facility-administered medications for this visit.    Allergies:   Review of patient's allergies indicates no known allergies.    Social History:  The patient  reports that he has never smoked. He does not  have any smokeless tobacco history on file. He reports that he does not drink alcohol or use illicit drugs.   Family History:  The patient's family history includes Arrhythmia in his mother; Healthy in his sister; Heart attack (age of onset: 6462) in his father.    ROS:  General:no colds or fevers, no weight changes Skin:no rashes or ulcers HEENT:no blurred vision, no congestion CV:see HPI PUL:see HPI GI:no diarrhea constipation or melena, no indigestion GU:no hematuria, no dysuria MS:no joint pain, no claudication Neuro:no syncope, no lightheadedness Endo:no diabetes, no thyroid disease  Wt Readings from  Last 3 Encounters:  02/25/15 199 lb 12.8 oz (90.629 kg)  07/17/14 196 lb 6.9 oz (89.1 kg)  07/15/14 196 lb (88.905 kg)     PHYSICAL EXAM: VS:  BP 108/70 mmHg  Pulse 57  Ht 5\' 9"  (1.753 m)  Wt 199 lb 12.8 oz (90.629 kg)  BMI 29.49 kg/m2 , BMI Body mass index is 29.49 kg/(m^2). General:Pleasant affect, NAD Skin:Warm and dry, brisk capillary refill HEENT:normocephalic, sclera clear, mucus membranes moist Neck:supple, no JVD, no bruits  Heart:S1S2 RRR without murmur, gallup, rub or click Lungs:clear without rales, rhonchi, or wheezes DGU:YQIHAbd:soft, non tender, + BS, do not palpate liver spleen or masses Ext:no lower ext edema, 2+ pedal pulses, 2+ radial pulses Neuro:alert and oriented X 3, MAE, follows commands, + facial symmetry    EKG:  EKG is ordered today. The ekg ordered today demonstrates SB rate 57 and in no acute changes from 05/2014    Recent Labs: 06/05/2014: ALT 27; TSH 2.990 06/07/2014: BUN 14; Creatinine 1.04; Hemoglobin 14.3; Platelets 203; Potassium 3.9; Sodium 139    Lipid Panel    Component Value Date/Time   CHOL 205* 06/06/2014 0405   TRIG 214* 06/06/2014 0405   HDL 29* 06/06/2014 0405   CHOLHDL 7.1 06/06/2014 0405   VLDL 43* 06/06/2014 0405   LDLCALC 133* 06/06/2014 0405       Other studies Reviewed: Additional studies/ records that were reviewed today include: previous cardiac cath and notes..   ASSESSMENT AND PLAN:  1.  CAD, multiple vessel, culprit RCA -prox with DES placed, total LCX-old, mod LAD- FFR not Hemodynamically signficant 06/06/14  Now with chest tightness with going to work may be stress but also at end of work out.  Resolves with rest.  These symptoms are fairly new.  Will do stress test Exercise myoivew- to eval for ischemia.  Follow up with Dr. Erlene QuanJ. Berry in 6 months unless stress test +, then will discuss with Dr. Erlene QuanJ. Berry for further plan.  2.Hyperlipidemia LDL goal <70 On statin therapy with recent LDL performed 06/06/14 of 133.  Atorvastatin 80 mg daily which is begun on the day of his cardiac catheterization. Will recheck lipids.  3.Essential hypertension Controlled on current medications  4. Side effects to ACE with severe cough.  Stop lisinopril though it may take up to 6 weeks for cough to be resolved.  Add diovan 160 mg daily.    Current medicines are reviewed with the patient today.  The patient Has no concerns regarding medicines.  The following changes have been made:  See above Labs/ tests ordered today include:see above  Disposition:   FU:  see above  Signed, Leone BrandINGOLD,Makenli Derstine R, NP  02/26/2015 4:02 PM    [facility] [address], [place], [place]  [address]/ [address] [place], [place] Phone: [phone]; Fax: [phone]  [phone]

## 2015-02-26 ENCOUNTER — Encounter: Payer: Self-pay | Admitting: Cardiology

## 2015-03-19 ENCOUNTER — Telehealth (HOSPITAL_COMMUNITY): Payer: Self-pay

## 2015-03-19 LAB — COMPREHENSIVE METABOLIC PANEL
ALT: 44 U/L (ref 0–53)
AST: 25 U/L (ref 0–37)
Albumin: 4.3 g/dL (ref 3.5–5.2)
Alkaline Phosphatase: 59 U/L (ref 39–117)
BUN: 17 mg/dL (ref 6–23)
CALCIUM: 9.1 mg/dL (ref 8.4–10.5)
CHLORIDE: 106 meq/L (ref 96–112)
CO2: 25 mEq/L (ref 19–32)
Creat: 1.06 mg/dL (ref 0.50–1.35)
Glucose, Bld: 95 mg/dL (ref 70–99)
POTASSIUM: 4.6 meq/L (ref 3.5–5.3)
SODIUM: 140 meq/L (ref 135–145)
Total Bilirubin: 0.7 mg/dL (ref 0.2–1.2)
Total Protein: 6.4 g/dL (ref 6.0–8.3)

## 2015-03-19 LAB — LIPID PANEL
CHOLESTEROL: 85 mg/dL (ref 0–200)
HDL: 27 mg/dL — AB (ref >=40)
LDL Cholesterol: 35 mg/dL (ref 0–99)
Total CHOL/HDL Ratio: 3.1 Ratio
Triglycerides: 114 mg/dL (ref <150)
VLDL: 23 mg/dL (ref 0–40)

## 2015-03-19 NOTE — Telephone Encounter (Signed)
Encounter complete. 

## 2015-03-24 ENCOUNTER — Ambulatory Visit (HOSPITAL_COMMUNITY)
Admission: RE | Admit: 2015-03-24 | Discharge: 2015-03-24 | Disposition: A | Discharge status: Home / Self Care | Payer: BLUE CROSS/BLUE SHIELD | Source: Ambulatory Visit | Attending: Cardiology | Admitting: Cardiology

## 2015-03-24 ENCOUNTER — Ambulatory Visit (INDEPENDENT_AMBULATORY_CARE_PROVIDER_SITE_OTHER): Payer: BLUE CROSS/BLUE SHIELD | Admitting: Cardiovascular Disease

## 2015-03-24 ENCOUNTER — Encounter: Payer: Self-pay | Admitting: Cardiovascular Disease

## 2015-03-24 VITALS — BP 110/72 | HR 72 | Ht 69.0 in | Wt 192.0 lb

## 2015-03-24 DIAGNOSIS — R0789 Other chest pain: Secondary | ICD-10-CM

## 2015-03-24 DIAGNOSIS — I10 Essential (primary) hypertension: Secondary | ICD-10-CM | POA: Diagnosis not present

## 2015-03-24 DIAGNOSIS — Z01812 Encounter for preprocedural laboratory examination: Secondary | ICD-10-CM | POA: Diagnosis not present

## 2015-03-24 DIAGNOSIS — E785 Hyperlipidemia, unspecified: Secondary | ICD-10-CM

## 2015-03-24 MED ORDER — TECHNETIUM TC 99M SESTAMIBI GENERIC - CARDIOLITE
10.8000 | Freq: Once | INTRAVENOUS | Status: AC | PRN
  Administered 2015-03-24: 11 via INTRAVENOUS

## 2015-03-24 MED ORDER — TECHNETIUM TC 99M SESTAMIBI GENERIC - CARDIOLITE
31.2000 | Freq: Once | INTRAVENOUS | Status: AC | PRN
  Administered 2015-03-24: 31.2 via INTRAVENOUS

## 2015-03-24 NOTE — Procedures (Addendum)
[facility] [address] [place] [address] [phone]  Cardiology Nuclear Med Study  Tyler DickRoger S Holder is a 54 y.o. male     MRN : [id number]     DOB: 30-Oct-1961  Procedure Date: 03/24/2015  Nuclear Med Background Indication for Stress Test:  follow up CAD History:  CAD, prior MPI study 6/15 ischemic, EF=45% Cardiac Risk Factors: Hypertension  Symptoms:  Chest Pain and DOE   Nuclear Pre-Procedure Caffeine/Decaff Intake:  1:00am NPO After: 9:00am   IV Site: R Antecubital  IV 0.9% NS with Angio Cath:  22g  Chest Size (in):  42 IV Started by: Koren Shiverobin Moffitt, CNMT  Height: 5\' 9"  (1.753 m)  Cup Size: n/a  BMI:  Body mass index is 29.37 kg/(m^2). Weight:  199 lb (90.266 kg)   Tech Comments:  n/a    Nuclear Med Study 1 or 2 day study: 1 day  Stress Test Type:  Stress  Order Authorizing Provider:  Nanetta BattyJonathan Berry, MD   Resting Radionuclide: Technetium 205m Sestamibi  Resting Radionuclide Dose: 10.8 mCi   Stress Radionuclide:  Technetium 325m Sestamibi  Stress Radionuclide Dose: 31.2 mCi           Stress Protocol Rest HR: 51 Stress HR: 151  Rest BP: 109/77 Stress BP: 141/77  Exercise Time (min): 10:31 METS: 12.60   Predicted Max HR: 167 bpm % Max HR: 90.42 bpm Rate Pressure Product: [id number]  Dose of Adenosine (mg):  n/a Dose of Lexiscan: n/a mg  Dose of Atropine (mg): n/a Dose of Dobutamine: n/a mcg/kg/min (at max HR)  Stress Test Technologist: Ernestene MentionGwen Farrington, CCT Nuclear Technologist: Gonzella LexPam Phillips, CNMT   Rest Procedure:  Myocardial perfusion imaging was performed at rest 45 minutes following the intravenous administration of Technetium 305m Sestamibi. Stress Procedure:  The patient performed treadmill exercise using a Bruce  Protocol for 10 minutes 31 seconds. The patient stopped due to shortness of breath and generalized fatigue. Patient complained of chest tightness which resolved during 3 minutes of recovery. There were  significant ST-T wave changes.  Technetium 845m Sestamibi was injected IV at peak exercise and myocardial perfusion imaging was performed after a brief delay.  Transient Ischemic Dilatation (Normal <1.22):  1.05  QGS EDV:  114 ml QGS ESV:  60 ml LV Ejection Fraction: 48%    Rest ECG: Sinus bradycardia with no ST changes.  Stress ECG: No significant ST segment change suggestive of ischemia.  QPS Raw Data Images:  Acquisition technically good; normal left ventricular size. Stress Images:  There is decreased uptake in the inferior lateral wall. Rest Images:  Normal homogeneous uptake in all areas of the myocardium. Subtraction (SDS):  These findings are consistent with ischemia.  Impression Exercise Capacity:  Good exercise capacity. BP Response:  Normal blood pressure response. Clinical Symptoms:  Positive chest pain and dyspnea. ECG Impression:  Borderline ST changes. Comparison with Prior Nuclear Study: No significant change from previous study  Overall Impression:  Intermediate risk stress nuclear study with a moderate size, severe intensity, reversible inferior lateral defect consistent with moderate ischemia.  LV Wall Motion:  Hypokinesis of the basal inferior wall.   Olga MillersBrian Crenshaw, MD  03/24/2015 5:11 PM

## 2015-03-24 NOTE — Progress Notes (Signed)
03/24/2015 Tyler Holder   November 27, 1961  [id number]  Primary Physician Leanor RubensteinSUN,VYVYAN Y, MD Primary Cardiologist: Runell GessJonathan J. Kelden Lavallee MD Roseanne RenoFACP,FACC,FAHA, FSCAI   HPI:  Tyler Holder is a 54 year old appearing married Caucasian male father of 2 children, grandfather and one grandchild who does HVAC for a living. He is referred by his primary care physician for evaluation of chest pain. I last saw him in the office 07/15/14 and since that time he's had intervention by Dr. Bevelyn BucklesVaricosity on the dominant RCA using a drug-eluting stent (3.5 mm x 18 mm).This pain is new onset and began 7-8 weeks ago. It waxes and wanes. It radiated to his throat and left upper extremity. His cardiac risk factors include hypertension and family history (father died of myocardial infarction in his early 5450s). He had a Myoview stress test performed/18/15 that was high risk and underwent cardiac catheterization the following day by Dr. Eldridge DaceVaranasi revealing three-vessel disease. His LAD was intermediate in severity and was not be significant by FFR. The circumflex coronary artery was nondominant and chronically occluded. The RCA was dominant and had a high-grade lesion which was stented with a drug-eluting stent (3.5 mm x 18 mm long resolute). The circumflex did receive collaterals from the RCA. He Doebler has been ischemic symptoms which brought him to me initially. After his stent procedure if symptoms improved however recently he's had exercise-induced chest heaviness with a Myoview stress test that showed inferolateral ischemia.   Current Outpatient Prescriptions  Medication Sig Dispense Refill  . ALPRAZolam (XANAX) 0.25 MG tablet Take 1 tablet (0.25 mg total) by mouth daily as needed for anxiety. 30 tablet 0  . amLODipine (NORVASC) 5 MG tablet Take 1 tablet (5 mg total) by mouth daily. 180 tablet 3  . aspirin 81 MG tablet Take 81 mg by mouth daily.    [name]. atorvastatin (LIPITOR) 80 MG tablet TAKE 1 TABLET BY MOUTH DAILY AT 6 PM 30 tablet 6  .  clopidogrel (PLAVIX) 75 MG tablet Take 1 tablet (75 mg total) by mouth daily with breakfast. 30 tablet 11  . nitroGLYCERIN (NITROSTAT) 0.4 MG SL tablet Place 1 tablet (0.4 mg total) under the tongue every 5 (five) minutes x 3 doses as needed for chest pain. 25 tablet 4  . valsartan (DIOVAN) 160 MG tablet Take 1 tablet (160 mg total) by mouth daily. 30 tablet 6   No current facility-administered medications for this visit.    Allergies  Allergen Reactions  . Lisinopril Cough    History   Social History  . Marital Status: Married    Spouse Name: N/A  . Number of Children: N/A  . Years of Education: N/A   Occupational History  . Not on file.   Social History Main Topics  . Smoking status: Never Smoker   . Smokeless tobacco: Not on file  . Alcohol Use: No  . Drug Use: No  . Sexual Activity: Not on file   Other Topics Concern  . Not on file   Social History Narrative     Review of Systems: General: negative for chills, fever, night sweats or weight changes.  Cardiovascular: negative for chest pain, dyspnea on exertion, edema, orthopnea, palpitations, paroxysmal nocturnal dyspnea or shortness of breath Dermatological: negative for rash Respiratory: negative for cough or wheezing Urologic: negative for hematuria Abdominal: negative for nausea, vomiting, diarrhea, bright red blood per rectum, melena, or hematemesis Neurologic: negative for visual changes, syncope, or dizziness All other systems reviewed and are otherwise negative except  as noted above.    Blood pressure 110/72, pulse 72, height 5\' 9"  (1.753 m), weight 192 lb (87.091 kg).  General appearance: alert and no distress Neck: no adenopathy, no carotid bruit, no JVD, supple, symmetrical, trachea midline and thyroid not enlarged, symmetric, no tenderness/mass/nodules Lungs: clear to auscultation bilaterally Heart: regular rate and rhythm, S1, S2 normal, no murmur, click, rub or gallop Extremities: extremities  normal, atraumatic, no cyanosis or edema  EKG not performed today  ASSESSMENT AND PLAN:   Hypertension History of hypertension with blood pressure measured at 110/72. He is on amlodipine and valsartan. Continue current meds at current dosing   Hyperlipidemia LDL goal <70 History of hyperlipidemia on atorvastatin 80 mg a day with recent lipid profile performed 03/18/15 revealed a total cholesterol 85, LDL 35 and HDL of 27.   CAD, multiple vessel, culprit RCA -prox with DES placed, total LCX-old, mod LAD- FFR not Hemodynamically signficant 06/06/14  History of CAD status post RCA stenting by Dr. Eldridge DaceVaranasi in July last year. He denied critical LAD disease and an occluded nondominant circumflex. The circumflex received collaterals from the RCA. He enjoyed clinical improvement post stenting however recently he's had exercise-induced chest heaviness and a Myoview stress test performed today showed inferolateral ischemia with some ST segment depression in recovery. Based on this, I'm inclined to proceed with outpatient diagnostic coronary arteriography to define his anatomy and rule out progression of disease. I discussed with the patient's watchmaker to proceed this coming Monday.Runell Gess.       Jaysten Essner J. Jeray Shugart MD FACP,FACC,FAHA, [facility]SCAI 03/24/2015 5:06 PM

## 2015-03-24 NOTE — Assessment & Plan Note (Signed)
History of CAD status post RCA stenting by Dr. Eldridge DaceVaranasi in July last year. He denied critical LAD disease and an occluded nondominant circumflex. The circumflex received collaterals from the RCA. He enjoyed clinical improvement post stenting however recently he's had exercise-induced chest heaviness and a Myoview stress test performed today showed inferolateral ischemia with some ST segment depression in recovery. Based on this, I'm inclined to proceed with outpatient diagnostic coronary arteriography to define his anatomy and rule out progression of disease. I discussed with the patient's watchmaker to proceed this coming Monday..[name]

## 2015-03-24 NOTE — Assessment & Plan Note (Signed)
History of hypertension with blood pressure measured at 110/72. He is on amlodipine and valsartan. Continue current meds at current dosing

## 2015-03-24 NOTE — Assessment & Plan Note (Signed)
History of hyperlipidemia on atorvastatin 80 mg a day with recent lipid profile performed 03/18/15 revealed a total cholesterol 85, LDL 35 and HDL of 27.

## 2015-03-24 NOTE — Patient Instructions (Signed)
Your physician has requested that you have a cardiac catheterization- Radial (Monday 03/30/15). Cardiac catheterization is used to diagnose and/or treat various heart conditions. Doctors may recommend this procedure for a number of different reasons. The most common reason is to evaluate chest pain. Chest pain can be a symptom of coronary artery disease (CAD), and cardiac catheterization can show whether plaque is narrowing or blocking your heart's arteries. This procedure is also used to evaluate the valves, as well as measure the blood flow and oxygen levels in different parts of your heart. For further information please visit [email or web address].   Following your catheterization, you will not be allowed to drive for 3 days.  No lifting, pushing, or pulling greater that 10 pounds is allowed for 1 week.  You will be required to have the following tests prior to the procedure:  1. Blood work-the blood work can be done no more than 7 days prior to the procedure.  [facility].  There is one downstairs on the first floor of this building and [facility] ([address])

## 2015-03-25 ENCOUNTER — Encounter: Payer: Self-pay | Admitting: Cardiovascular Disease

## 2015-03-25 ENCOUNTER — Telehealth: Payer: Self-pay | Admitting: Cardiovascular Disease

## 2015-03-25 NOTE — Telephone Encounter (Signed)
Spoke with patient @ 8:21 am regarding cardiac cath scheduled for Monday 03/30/15 @ 9:00 am----[facility] 7[facility]  NPO after midnight except for morning meds--that does include Plavix and ASA 81 mg.  Patient was instructed to have lab work doneno later than Thursday 03/26/15.  He was also instructed to prepare for an overnight stay and a driver will be needed upon discharge.  Patient voiced his understanding.   I will mail a copy of instructions to the patient.

## 2015-03-25 NOTE — Telephone Encounter (Signed)
Called and left message for patient to call regarding heart cath scheduled for Monday 03/30/15

## 2015-03-27 LAB — APTT: aPTT: 32 seconds (ref 24–37)

## 2015-03-27 LAB — CBC
HCT: 44.3 % (ref 39.0–52.0)
HEMOGLOBIN: 14.6 g/dL (ref 13.0–17.0)
MCH: 29.7 pg (ref 26.0–34.0)
MCHC: 33 g/dL (ref 30.0–36.0)
MCV: 90 fL (ref 78.0–100.0)
MPV: 10.9 fL (ref 8.6–12.4)
Platelets: 218 10*3/uL (ref 150–400)
RBC: 4.92 MIL/uL (ref 4.22–5.81)
RDW: 13.2 % (ref 11.5–15.5)
WBC: 8.6 10*3/uL (ref 4.0–10.5)

## 2015-03-27 LAB — BASIC METABOLIC PANEL
BUN: 19 mg/dL (ref 6–23)
CHLORIDE: 104 meq/L (ref 96–112)
CO2: 24 mEq/L (ref 19–32)
Calcium: 8.9 mg/dL (ref 8.4–10.5)
Creat: 0.99 mg/dL (ref 0.50–1.35)
Glucose, Bld: 91 mg/dL (ref 70–99)
POTASSIUM: 4.4 meq/L (ref 3.5–5.3)
Sodium: 138 mEq/L (ref 135–145)

## 2015-03-27 LAB — PROTIME-INR
INR: 1.02 (ref <1.50)
PROTHROMBIN TIME: 13.4 s (ref 11.6–15.2)

## 2015-03-30 ENCOUNTER — Encounter (HOSPITAL_COMMUNITY)
Admission: RE | Disposition: A | Discharge status: Home / Self Care | Payer: Self-pay | Source: Ambulatory Visit | Attending: Cardiovascular Disease

## 2015-03-30 ENCOUNTER — Observation Stay (HOSPITAL_COMMUNITY)
Admission: RE | Admit: 2015-03-30 | Discharge: 2015-03-31 | Disposition: A | Discharge status: Home / Self Care | Payer: BLUE CROSS/BLUE SHIELD | Source: Ambulatory Visit | Attending: Cardiovascular Disease | Admitting: Cardiovascular Disease

## 2015-03-30 ENCOUNTER — Encounter: Payer: Self-pay | Admitting: *Deleted

## 2015-03-30 ENCOUNTER — Encounter (HOSPITAL_COMMUNITY): Payer: Self-pay | Admitting: General Practice

## 2015-03-30 DIAGNOSIS — R001 Bradycardia, unspecified: Secondary | ICD-10-CM | POA: Diagnosis present

## 2015-03-30 DIAGNOSIS — I10 Essential (primary) hypertension: Secondary | ICD-10-CM | POA: Insufficient documentation

## 2015-03-30 DIAGNOSIS — Z955 Presence of coronary angioplasty implant and graft: Secondary | ICD-10-CM | POA: Diagnosis not present

## 2015-03-30 DIAGNOSIS — L7622 Postprocedural hemorrhage and hematoma of skin and subcutaneous tissue following other procedure: Secondary | ICD-10-CM | POA: Diagnosis not present

## 2015-03-30 DIAGNOSIS — Z79899 Other long term (current) drug therapy: Secondary | ICD-10-CM | POA: Diagnosis not present

## 2015-03-30 DIAGNOSIS — R931 Abnormal findings on diagnostic imaging of heart and coronary circulation: Secondary | ICD-10-CM | POA: Diagnosis not present

## 2015-03-30 DIAGNOSIS — Z7902 Long term (current) use of antithrombotics/antiplatelets: Secondary | ICD-10-CM | POA: Insufficient documentation

## 2015-03-30 DIAGNOSIS — R233 Spontaneous ecchymoses: Secondary | ICD-10-CM | POA: Diagnosis not present

## 2015-03-30 DIAGNOSIS — Z7982 Long term (current) use of aspirin: Secondary | ICD-10-CM | POA: Diagnosis not present

## 2015-03-30 DIAGNOSIS — I25119 Atherosclerotic heart disease of native coronary artery with unspecified angina pectoris: Principal | ICD-10-CM | POA: Insufficient documentation

## 2015-03-30 DIAGNOSIS — I2582 Chronic total occlusion of coronary artery: Secondary | ICD-10-CM | POA: Diagnosis not present

## 2015-03-30 DIAGNOSIS — I209 Angina pectoris, unspecified: Secondary | ICD-10-CM | POA: Diagnosis present

## 2015-03-30 DIAGNOSIS — R9439 Abnormal result of other cardiovascular function study: Secondary | ICD-10-CM | POA: Diagnosis present

## 2015-03-30 DIAGNOSIS — E785 Hyperlipidemia, unspecified: Secondary | ICD-10-CM | POA: Diagnosis not present

## 2015-03-30 DIAGNOSIS — Z01812 Encounter for preprocedural laboratory examination: Secondary | ICD-10-CM

## 2015-03-30 DIAGNOSIS — I2510 Atherosclerotic heart disease of native coronary artery without angina pectoris: Secondary | ICD-10-CM | POA: Diagnosis not present

## 2015-03-30 HISTORY — DX: Bradycardia, unspecified: R00.1

## 2015-03-30 HISTORY — PX: LEFT HEART CATHETERIZATION WITH CORONARY ANGIOGRAM: SHX5451

## 2015-03-30 HISTORY — PX: CARDIAC CATHETERIZATION: SHX172

## 2015-03-30 SURGERY — LEFT HEART CATHETERIZATION WITH CORONARY ANGIOGRAM

## 2015-03-30 MED ORDER — SODIUM CHLORIDE 0.9 % IV SOLN: 250.0000 mL | INTRAVENOUS | Status: DC | PRN

## 2015-03-30 MED ORDER — SODIUM CHLORIDE 0.9 % IJ SOLN: 3.0000 mL | INTRAMUSCULAR | Status: DC | PRN

## 2015-03-30 MED ORDER — VERAPAMIL HCL 2.5 MG/ML IV SOLN
INTRAVENOUS | Status: AC
Start: 2015-03-30 — End: 2015-03-30
  Filled 2015-03-30: qty 2

## 2015-03-30 MED ORDER — ALPRAZOLAM 0.25 MG PO TABS: 0.2500 mg | ORAL_TABLET | Freq: Every day | ORAL | Status: DC | PRN

## 2015-03-30 MED ORDER — FENTANYL CITRATE 0.05 MG/ML IJ SOLN
INTRAMUSCULAR | Status: AC
  Filled 2015-03-30: qty 2

## 2015-03-30 MED ORDER — ACETAMINOPHEN 325 MG PO TABS: 650.0000 mg | ORAL_TABLET | ORAL | Status: DC | PRN

## 2015-03-30 MED ORDER — IRBESARTAN 150 MG PO TABS
150.0000 mg | ORAL_TABLET | Freq: Every day | ORAL | Status: DC
  Administered 2015-03-31: 150 mg via ORAL
  Filled 2015-03-30: qty 1

## 2015-03-30 MED ORDER — SODIUM CHLORIDE 0.9 % IJ SOLN
3.0000 mL | Freq: Two times a day (BID) | INTRAMUSCULAR | Status: DC
  Administered 2015-03-30: 3 mL via INTRAVENOUS

## 2015-03-30 MED ORDER — SODIUM CHLORIDE 0.9 % IV SOLN: INTRAVENOUS | Status: AC

## 2015-03-30 MED ORDER — ONDANSETRON HCL 4 MG/2ML IJ SOLN: 4.0000 mg | Freq: Four times a day (QID) | INTRAMUSCULAR | Status: DC | PRN

## 2015-03-30 MED ORDER — ASPIRIN 81 MG PO CHEW
CHEWABLE_TABLET | ORAL | Status: AC
  Administered 2015-03-30: 81 mg via ORAL
  Filled 2015-03-30: qty 1

## 2015-03-30 MED ORDER — AMLODIPINE BESYLATE 5 MG PO TABS
5.0000 mg | ORAL_TABLET | Freq: Every day | ORAL | Status: DC
  Administered 2015-03-31: 5 mg via ORAL
  Filled 2015-03-30: qty 1

## 2015-03-30 MED ORDER — HEPARIN (PORCINE) IN NACL 2-0.9 UNIT/ML-% IJ SOLN
INTRAMUSCULAR | Status: AC
  Filled 2015-03-30: qty 1500

## 2015-03-30 MED ORDER — OXYCODONE-ACETAMINOPHEN 5-325 MG PO TABS
ORAL_TABLET | ORAL | Status: AC
  Filled 2015-03-30: qty 1

## 2015-03-30 MED ORDER — HEPARIN SODIUM (PORCINE) 1000 UNIT/ML IJ SOLN
INTRAMUSCULAR | Status: AC
  Filled 2015-03-30: qty 1

## 2015-03-30 MED ORDER — SODIUM CHLORIDE 0.9 % IV SOLN
INTRAVENOUS | Status: DC
  Administered 2015-03-30: 08:00:00 via INTRAVENOUS

## 2015-03-30 MED ORDER — CLOPIDOGREL BISULFATE 75 MG PO TABS: 75.0000 mg | ORAL_TABLET | Freq: Every day | ORAL | Status: DC

## 2015-03-30 MED ORDER — ASPIRIN 81 MG PO CHEW
81.0000 mg | CHEWABLE_TABLET | ORAL | Status: AC
  Administered 2015-03-30: 81 mg via ORAL

## 2015-03-30 MED ORDER — ASPIRIN EC 81 MG PO TBEC
81.0000 mg | DELAYED_RELEASE_TABLET | Freq: Every day | ORAL | Status: DC
  Administered 2015-03-31: 81 mg via ORAL
  Filled 2015-03-30: qty 1

## 2015-03-30 MED ORDER — ASPIRIN 81 MG PO CHEW: 81.0000 mg | CHEWABLE_TABLET | Freq: Every day | ORAL | Status: DC

## 2015-03-30 MED ORDER — SODIUM CHLORIDE 0.9 % IJ SOLN
3.0000 mL | Freq: Two times a day (BID) | INTRAMUSCULAR | Status: DC
  Administered 2015-03-31: 3 mL via INTRAVENOUS

## 2015-03-30 MED ORDER — NITROGLYCERIN 1 MG/10 ML FOR IR/CATH LAB
INTRA_ARTERIAL | Status: AC
Start: 2015-03-30 — End: 2015-03-30
  Filled 2015-03-30: qty 10

## 2015-03-30 MED ORDER — OXYCODONE-ACETAMINOPHEN 5-325 MG PO TABS
1.0000 | ORAL_TABLET | ORAL | Status: DC | PRN
Start: 2015-03-30 — End: 2015-03-31
  Administered 2015-03-30: 1 via ORAL
  Filled 2015-03-30: qty 1

## 2015-03-30 MED ORDER — LIDOCAINE HCL (PF) 1 % IJ SOLN
INTRAMUSCULAR | Status: AC
  Filled 2015-03-30: qty 30

## 2015-03-30 MED ORDER — NITROGLYCERIN 0.4 MG SL SUBL: 0.4000 mg | SUBLINGUAL_TABLET | SUBLINGUAL | Status: DC | PRN

## 2015-03-30 MED ORDER — CLOPIDOGREL BISULFATE 75 MG PO TABS
75.0000 mg | ORAL_TABLET | Freq: Every day | ORAL | Status: DC
  Administered 2015-03-31: 75 mg via ORAL
  Filled 2015-03-30 (×2): qty 1

## 2015-03-30 MED ORDER — ATORVASTATIN CALCIUM 80 MG PO TABS
80.0000 mg | ORAL_TABLET | Freq: Every day | ORAL | Status: DC
  Filled 2015-03-30: qty 1

## 2015-03-30 MED ORDER — MIDAZOLAM HCL 2 MG/2ML IJ SOLN
INTRAMUSCULAR | Status: AC
  Filled 2015-03-30: qty 2

## 2015-03-30 NOTE — Progress Notes (Signed)
[facility] and pt arrived to the unit via wheelchair with belongings and wife at side at 1550. Pt A&O x4; VSS; telemetry applied and verified; denies any pain; right hand cath site level 0 with clean, dry and intact dsg to site; right brachial arm area hematoma marked remains intact with no additional hematoma or bruising noted beyond arm marker. Pt in bed comfortably with call light within reach; and spouse at side. Right cath site elevated on pillow. Will continue to monitor pt quietly. Arabella MerlesP. Amo Alicen Donalson RN.

## 2015-03-30 NOTE — Discharge Instructions (Signed)
Radial Site Care °Refer to this sheet in the next few weeks. These instructions provide you with information on caring for yourself after your procedure. Your caregiver may also give you more specific instructions. Your treatment has been planned according to current medical practices, but problems sometimes occur. Call your caregiver if you have any problems or questions after your procedure. °HOME CARE INSTRUCTIONS °· You may shower the day after the procedure. Remove the bandage (dressing) and gently wash the site with plain soap and water. Gently pat the site dry. °· Do not apply powder or lotion to the site. °· Do not submerge the affected site in water for 3 to 5 days. °· Inspect the site at least twice daily. °· Do not flex or bend the affected arm for 24 hours. °· No lifting over 5 pounds (2.3 kg) for 5 days after your procedure. °· Do not drive home if you are discharged the same day of the procedure. Have someone else drive you. °· You may drive 24 hours after the procedure unless otherwise instructed by your caregiver. °· Do not operate machinery or power tools for 24 hours. °· A responsible adult should be with you for the first 24 hours after you arrive home. °What to expect: °· Any bruising will usually fade within 1 to 2 weeks. °· Blood that collects in the tissue (hematoma) may be painful to the touch. It should usually decrease in size and tenderness within 1 to 2 weeks. °SEEK IMMEDIATE MEDICAL CARE IF: °· You have unusual pain at the radial site. °· You have redness, warmth, swelling, or pain at the radial site. °· You have drainage (other than a small amount of blood on the dressing). °· You have chills. °· You have a fever or persistent symptoms for more than 72 hours. °· You have a fever and your symptoms suddenly get worse. °· Your arm becomes pale, cool, tingly, or numb. °· You have heavy bleeding from the site. Hold pressure on the site and call 911. °Document Released: 01/07/2011 Document  Revised: 02/27/2012 Document Reviewed: 01/07/2011 °ExitCare® Patient Information ©2015 [employer]. This information is not intended to replace advice given to you by your health care provider. Make sure you discuss any questions you have with your health care provider. ° °

## 2015-03-30 NOTE — Progress Notes (Signed)
Pt to be admitted to 2west bed 6.  Pt denies pain at this time. Sherian ReinL. Ingold, PA in to see pt.

## 2015-03-30 NOTE — CV Procedure (Signed)
Ruthy DickRoger S Gossard is a 54 y.o. male    [id number] LOCATION:  FACILITY: [facility]  PHYSICIAN: Nanetta BattyJonathan Maksymilian Mabey, M.D. 12-05-1961   DATE OF PROCEDURE:  03/30/2015  DATE OF DISCHARGE:     CARDIAC CATHETERIZATION     History obtained from chart review. Mr. 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 He was referred by his primary care physician for evaluation of chest pain. I last saw him in the office 07/15/14 and since that time he's had intervention by Dr. Bevelyn BucklesVaricosity on the dominant RCA using a drug-eluting stent (3.5 mm x 18 mm).This pain is new onset and began 7-8 weeks ago. It waxes and wanes. It radiated to his throat and left upper extremity. His cardiac risk factors include hypertension and family history (father died of myocardial infarction in his early 5060s). He had a Myoview stress test performed 07/05/14 that was high risk and underwent cardiac catheterization the following day by Dr. Eldridge DaceVaranasi revealing three-vessel disease. His LAD was intermediate in severity and was not be significant by FFR. The circumflex coronary artery was nondominant and chronically occluded. The RCA was dominant and had a high-grade lesion which was stented with a drug-eluting stent (3.5 mm x 18 mm long resolute). The circumflex did receive collaterals from the RCA. He Doebler has been ischemic symptoms which brought him to me initially. After his stent procedure if symptoms improved however recently he's had exercise-induced chest heaviness with a Myoview stress test that showed inferolateral ischemia.   PROCEDURE DESCRIPTION:   The patient was brought to the second floor [facility] cath lab in the postabsorptive state. He was premedicated with Valium 5 mg by mouth, IV Versed and fentanyl. His right wrist was prepped and shaved in usual sterile fashion. Xylocaine 1% was used for local anesthesia. A 5/6 French sheath was  inserted into the right radial artery using standard Seldinger technique. The patient received 4500 units  of heparin  intravenously.  A 5 [place]French TIG catheter and pigtail catheters were used for selective coronary angiography and left ventriculography respectively. Visipaque dye was used for the entirety of the case. Retrograde aortic, left ventricular and pullback pressures were recorded. A total of 70 mL of contrast was administered to the patient. He received radial cocktail via the SideArm sheath.    HEMODYNAMICS:    AO SYSTOLIC/AO DIASTOLIC: 103/71   LV SYSTOLIC/LV DIASTOLIC: 97/10  ANGIOGRAPHIC RESULTS:   1. Left main; normal  2. LAD; 60% smooth mid 3. Left circumflex; nondominant with chronic total occlusion proximally with bridging collaterals. This is not changed since his prior cath. He does have a moderate to large ramus branch as well which is free of significant disease.  4. Right coronary artery; dominant with widely patent stent in the proximal portion and mild disease in the midportion. This gave off collaterals until the circumflex CTO 5. Left ventriculography; RAO left ventriculogram was performed using  25 mL of Visipaque dye at 12 mL/second. The overall LVEF estimated  60 %  Without wall motion abnormalities  IMPRESSION:Mr. Tyler Holder has unchanged anatomy compared to his last cath and intervention in July of last year. His RCA stent is widely patent. His mobilities stenosis is moderate and unchanged from his prior cath. In addition, his ischemia within the circumflex territory. He has a chronic total occlusion in the proximal circumflex. His symptoms are mostly during strenuous exercise. His Myoview abnormalities in the circumflex territory.  He may benefit from percutaneous intervention on the chronic total occlusion. At this point, we will continue to follow him medically and I will refer him to Dr. Eldridge Dace to review his percutaneous options. The sheath was removed and a TR band  was placed on the right wrist to achieve patent hemostasis. The patient left the lab in stable condition. He'll be discharged home after several hours and will see mid-level provider back in one to 2 weeks on a daily I'm in the office.  Runell Gess MD, [facility] 03/30/2015 9:35 AM

## 2015-03-30 NOTE — Progress Notes (Signed)
Called to see pt with hematoma in rt upper  Arm,  Pt had radial cath today.  Cath site without hematoma.  Area above elbow ant with hematoma, cath lab here and BP cuff applied to control bleeding.  I discussed with Dr. Clifton JamesMcAlhany and we will hold pressure with BP cuff for 30 min then slowly let up.  Pt with good color, capillary refill with BP up:  2 sec.  We will hold off release of radial band until upper arm controlled.  Will give percocet for pain.   This may resolve quickly- will hold off on admit until  Dr. Erlene QuanJ. Beckham Capistran can evaluate.    Family in room and aware as well.  I have examined the patient as well and apparently he had a spontaneous bleed into his bicep muscle which has significantly improved with blood pressure cuff inflation. We will continue to monitor for several hours and if it remains stable we'll send him home.   Runell GessJonathan J. Nakia Koble, M.D., FACP, [facility]acidFACC, Earl LagosFAHA, [facility]FSCAI [facility] [address] [place], [place]  [id number]  [phone] 03/30/2015 2:01 PM

## 2015-03-30 NOTE — Interval H&P Note (Signed)
Cath Lab Visit (complete for each Cath Lab visit)  Clinical Evaluation Leading to the Procedure:   ACS: No.  Non-ACS:    Anginal Classification: CCS II  Anti-ischemic medical therapy: Minimal Therapy (1 class of medications)  Non-Invasive Test Results: Intermediate-risk stress test findings: cardiac mortality 1-3%/year  Prior CABG: No previous CABG      History and Physical Interval Note:  03/30/2015 9:00 AM  Tyler Holder  has presented today for surgery, with the diagnosis of abnormal cardiolite  The various methods of treatment have been discussed with the patient and family. After consideration of risks, benefits and other options for treatment, the patient has consented to  Procedure(s): LEFT HEART CATHETERIZATION WITH CORONARY ANGIOGRAM (N/A) as a surgical intervention .  The patient's history has been reviewed, patient examined, no change in status, stable for surgery.  I have reviewed the patient's chart and labs.  Questions were answered to the patient's satisfaction.     Runell GessBERRY,Cheyann Blecha J

## 2015-03-30 NOTE — Progress Notes (Signed)
Pt now several hours after bleed in bicep.  Still tight though improved.  No pain but has had a percocet.  Discussed with Dr. Erlene QuanJ. Tyler Holder will keep overnight and discharge in AM to monitor.    Because of continued discomfort and some tightness in his biceps and elected to keep him overnight.  He does have a palpable radial pulse. If he remains stable he can be discharged home in the morning.   Runell GessJonathan J. Una Yeomans, M.D., FACP, [facility] CenterFACC, Earl LagosFAHA, [facility]SC[facility] [address] [place], [place]  [id number]  [phone] 03/30/2015 3:33 PM

## 2015-03-30 NOTE — H&P (View-Only) (Signed)
03/24/2015 Tyler Holder   November 27, 1961  [id number]  Primary Physician Leanor RubensteinSUN,VYVYAN Y, MD Primary Cardiologist: Runell GessJonathan J. 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 He is referred by his primary care physician for evaluation of chest pain. I last saw him in the office 07/15/14 and since that time he's had intervention by Dr. Bevelyn BucklesVaricosity on the dominant RCA using a drug-eluting stent (3.5 mm x 18 mm).This pain is new onset and began 7-8 weeks ago. It waxes and wanes. It radiated to his throat and left upper extremity. His cardiac risk factors include hypertension and family history (father died of myocardial infarction in his early 54). He had a Myoview stress test performed/18/15 that was high risk and underwent cardiac catheterization the following day by Dr. Eldridge DaceVaranasi revealing three-vessel disease. His LAD was intermediate in severity and was not be significant by FFR. The circumflex coronary artery was nondominant and chronically occluded. The RCA was dominant and had a high-grade lesion which was stented with a drug-eluting stent (3.5 mm x 18 mm long resolute). The circumflex did receive collaterals from the RCA. He Doebler has been ischemic symptoms which brought him to me initially. After his stent procedure if symptoms improved however recently he's had exercise-induced chest heaviness with a Myoview stress test that showed inferolateral ischemia.   Current Outpatient Prescriptions  Medication Sig Dispense Refill  . ALPRAZolam (XANAX) 0.25 MG tablet Take 1 tablet (0.25 mg total) by mouth daily as needed for anxiety. 30 tablet 0  . amLODipine (NORVASC) 5 MG tablet Take 1 tablet (5 mg total) by mouth daily. 180 tablet 3  . aspirin 81 MG tablet Take 81 mg by mouth daily.    [name]. atorvastatin (LIPITOR) 80 MG tablet TAKE 1 TABLET BY MOUTH DAILY AT 6 PM 30 tablet 6  .  clopidogrel (PLAVIX) 75 MG tablet Take 1 tablet (75 mg total) by mouth daily with breakfast. 30 tablet 11  . nitroGLYCERIN (NITROSTAT) 0.4 MG SL tablet Place 1 tablet (0.4 mg total) under the tongue every 5 (five) minutes x 3 doses as needed for chest pain. 25 tablet 4  . valsartan (DIOVAN) 160 MG tablet Take 1 tablet (160 mg total) by mouth daily. 30 tablet 6   No current facility-administered medications for this visit.    Allergies  Allergen Reactions  . Lisinopril Cough    History   Social History  . Marital Status: Married    Spouse Name: N/A  . Number of Children: N/A  . Years of Education: N/A   Occupational History  . Not on file.   Social History Main Topics  . Smoking status: Never Smoker   . Smokeless tobacco: Not on file  . Alcohol Use: No  . Drug Use: No  . Sexual Activity: Not on file   Other Topics Concern  . Not on file   Social History Narrative     Review of Systems: General: negative for chills, fever, night sweats or weight changes.  Cardiovascular: negative for chest pain, dyspnea on exertion, edema, orthopnea, palpitations, paroxysmal nocturnal dyspnea or shortness of breath Dermatological: negative for rash Respiratory: negative for cough or wheezing Urologic: negative for hematuria Abdominal: negative for nausea, vomiting, diarrhea, bright red blood per rectum, melena, or hematemesis Neurologic: negative for visual changes, syncope, or dizziness All other systems reviewed and are otherwise negative except  as noted above.    Blood pressure 110/72, pulse 72, height 5\' 9"  (1.753 m), weight 192 lb (87.091 kg).  General appearance: alert and no distress Neck: no adenopathy, no carotid bruit, no JVD, supple, symmetrical, trachea midline and thyroid not enlarged, symmetric, no tenderness/mass/nodules Lungs: clear to auscultation bilaterally Heart: regular rate and rhythm, S1, S2 normal, no murmur, click, rub or gallop Extremities: extremities  normal, atraumatic, no cyanosis or edema  EKG not performed today  ASSESSMENT AND PLAN:   Hypertension History of hypertension with blood pressure measured at 110/72. He is on amlodipine and valsartan. Continue current meds at current dosing   Hyperlipidemia LDL goal <70 History of hyperlipidemia on atorvastatin 80 mg a day with recent lipid profile performed 03/18/15 revealed a total cholesterol 85, LDL 35 and HDL of 27.   CAD, multiple vessel, culprit RCA -prox with DES placed, total LCX-old, mod LAD- FFR not Hemodynamically signficant 06/06/14  History of CAD status post RCA stenting by Dr. Eldridge DaceVaranasi in July last year. He denied critical LAD disease and an occluded nondominant circumflex. The circumflex received collaterals from the RCA. He enjoyed clinical improvement post stenting however recently he's had exercise-induced chest heaviness and a Myoview stress test performed today showed inferolateral ischemia with some ST segment depression in recovery. Based on this, I'm inclined to proceed with outpatient diagnostic coronary arteriography to define his anatomy and rule out progression of disease. I discussed with the patient's watchmaker to proceed this coming Monday.Runell Gess.       Hollie Bartus J. Imogene Gravelle MD FACP,FACC,FAHA, [facility] 03/24/2015 5:06 PM

## 2015-03-30 NOTE — Progress Notes (Signed)
Called to short stay for rt arm hematoma. Manual pressure being applied as i arrived. Hematoma present medial to right bicep. Area was aprox 3 inches by 5 inches, too large to compress manually. BP cuff applied and inflated to maintain pleth waveform and measured in right thumb. Cuff left in place for 10 minutes, then repositioned. Nada BoozerLaura Ingold  P. A.  Suggested the cuff remain for 30 minutes , maintaining a pleth wave form. Capillary refill was not longer than 2 seconds. Cuff removed at 11:15 , hematoma reduced in size as well as firmness. Dr. Allyson SabalBerry notified.

## 2015-03-30 NOTE — Progress Notes (Signed)
Report called to DillonPriscilla on 2 west.  IV converted to normal saline lock with site intact.  Pt transported to 2w06 via w/c.

## 2015-03-30 NOTE — Progress Notes (Signed)
Pt c/o pain at bend of arm.  Area feels soft to touch. Area of previous concern in bicep soft to palpation also.  Brian/Cath lab in to see pt along with L. Taylorngold, GeorgiaPA in to assess pt.

## 2015-03-31 ENCOUNTER — Encounter (HOSPITAL_COMMUNITY): Payer: Self-pay | Admitting: Physician Assistant

## 2015-03-31 ENCOUNTER — Other Ambulatory Visit: Payer: Self-pay | Admitting: Interventional Cardiology

## 2015-03-31 DIAGNOSIS — I25119 Atherosclerotic heart disease of native coronary artery with unspecified angina pectoris: Secondary | ICD-10-CM | POA: Diagnosis not present

## 2015-03-31 DIAGNOSIS — I209 Angina pectoris, unspecified: Secondary | ICD-10-CM

## 2015-03-31 DIAGNOSIS — R001 Bradycardia, unspecified: Secondary | ICD-10-CM | POA: Diagnosis present

## 2015-03-31 DIAGNOSIS — I10 Essential (primary) hypertension: Secondary | ICD-10-CM | POA: Diagnosis not present

## 2015-03-31 DIAGNOSIS — R233 Spontaneous ecchymoses: Secondary | ICD-10-CM | POA: Diagnosis not present

## 2015-03-31 DIAGNOSIS — I2510 Atherosclerotic heart disease of native coronary artery without angina pectoris: Secondary | ICD-10-CM | POA: Diagnosis not present

## 2015-03-31 LAB — BASIC METABOLIC PANEL
Anion gap: 7 (ref 5–15)
BUN: 15 mg/dL (ref 6–23)
CO2: 25 mmol/L (ref 19–32)
Calcium: 8.7 mg/dL (ref 8.4–10.5)
Chloride: 107 mmol/L (ref 96–112)
Creatinine, Ser: 0.98 mg/dL (ref 0.50–1.35)
GFR calc Af Amer: >90 mL/min (ref >90)
GFR calc non Af Amer: >90 mL/min (ref >90)
GLUCOSE: 110 mg/dL — AB (ref 70–99)
Potassium: 4.1 mmol/L (ref 3.5–5.1)
Sodium: 139 mmol/L (ref 135–145)

## 2015-03-31 LAB — CBC
HCT: 42.5 % (ref 39.0–52.0)
HEMOGLOBIN: 14.2 g/dL (ref 13.0–17.0)
MCH: 29.7 pg (ref 26.0–34.0)
MCHC: 33.4 g/dL (ref 30.0–36.0)
MCV: 88.9 fL (ref 78.0–100.0)
PLATELETS: 183 10*3/uL (ref 150–400)
RBC: 4.78 MIL/uL (ref 4.22–5.81)
RDW: 12.6 % (ref 11.5–15.5)
WBC: 10.2 10*3/uL (ref 4.0–10.5)

## 2015-03-31 MED ORDER — ISOSORBIDE MONONITRATE 15 MG HALF TABLET
15.0000 mg | ORAL_TABLET | Freq: Every day | ORAL | Status: DC
  Administered 2015-03-31: 15 mg via ORAL
  Filled 2015-03-31: qty 1

## 2015-03-31 MED ORDER — ISOSORBIDE MONONITRATE ER 30 MG PO TB24: 15.0000 mg | ORAL_TABLET | Freq: Every day | ORAL | Status: DC

## 2015-03-31 MED ORDER — OXYCODONE-ACETAMINOPHEN 5-325 MG PO TABS: 1.0000 | ORAL_TABLET | ORAL | Status: DC | PRN

## 2015-03-31 NOTE — Progress Notes (Signed)
Pt/wife stated was told by Dr. Eldridge DaceVaranasi that he was scheduled to have a procedure tomorrow 04/01/15? I did not see a progress note. Pt/wife concerned that if he needs a procedure that he might get discharged today and have to come back tomorrow. Pt/wife to clarify plan with rounding MD today. Offered pt ice pack or heat for right upper arm hematoma. Pt refused saying it was fine.  Pt resting with call bell within reach.  Will continue to monitor. Thomas HoffBurton, Lionell Matuszak McClintock, RN

## 2015-03-31 NOTE — Progress Notes (Signed)
   SUBJECTIVE:  Patient s/p diagnostic cath.  Pt with CTO of the Circumflex.  Dr. Allyson SabalBerry now referred the patient to me for CTO PCI due to persistent angina. He exercises at a high level and still is having some chest discomfort.  OBJECTIVE:   Vitals:   Filed Vitals:   03/30/15 1530 03/30/15 1558 03/30/15 2002 03/31/15 0444  BP: 110/71 110/71 98/55 104/63  Pulse: 65 56 69 58  Temp:  97.7 F (36.5 C) 97.6 F (36.4 C) 98.2 F (36.8 C)  TempSrc:  Oral Oral Oral  Resp: 16 18 18 18   Height:      Weight:      SpO2: 98% 98% 97% 97%   I&O's:   Intake/Output Summary (Last 24 hours) at 03/31/15 0941 Last data filed at 03/31/15 0900  Gross per 24 hour  Intake    480 ml  Output      0 ml  Net    480 ml   TELEMETRY: Reviewed telemetry pt in NSR:     PHYSICAL EXAM General: Well developed, well nourished, in no acute distress Head:   Normal cephalic and atramatic  Lungs:   Clear bilaterally to auscultation. Heart:   HRRR S1 S2  No JVD.   Abdomen: abdomen soft and non-tender Msk:  Back normal,  Normal strength and tone for age. Extremities:  Right arm bruise above the elbow, 2+ femoral pulses bilaterally, No edema.   Neuro: Alert and oriented. Psych:  Normal affect, responds appropriately Skin: No rash   LABS: Basic Metabolic Panel:  Recent Labs  16/09/9603/12/16 0440  NA 139  K 4.1  CL 107  CO2 25  GLUCOSE 110*  BUN 15  CREATININE 0.98  CALCIUM 8.7   Liver Function Tests: No results for input(s): AST, ALT, ALKPHOS, BILITOT, PROT, ALBUMIN in the last 72 hours. No results for input(s): LIPASE, AMYLASE in the last 72 hours. CBC:  Recent Labs  03/31/15 0440  WBC 10.2  HGB 14.2  HCT 42.5  MCV 88.9  PLT 183   Cardiac Enzymes: No results for input(s): CKTOTAL, CKMB, CKMBINDEX, TROPONINI in the last 72 hours. BNP: Invalid input(s): POCBNP D-Dimer: No results for input(s): DDIMER in the last 72 hours. Hemoglobin A1C: No results for input(s): HGBA1C in the last 72  hours. Fasting Lipid Panel: No results for input(s): CHOL, HDL, LDLCALC, TRIG, CHOLHDL, LDLDIRECT in the last 72 hours. Thyroid Function Tests: No results for input(s): TSH, T4TOTAL, T3FREE, THYROIDAB in the last 72 hours.  Invalid input(s): FREET3 Anemia Panel: No results for input(s): VITAMINB12, FOLATE, FERRITIN, TIBC, IRON, RETICCTPCT in the last 72 hours. Coag Panel:   Lab Results  Component Value Date   INR 1.02 03/24/2015   INR 0.92 06/05/2014    RADIOLOGY: No results found.    ASSESSMENT: PLAN:    1.  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 Will attempt intervention on Wednesday. The risks and benefits of CTO PCI were explained to the patient and he is willing to proceed. These risks include vessel dissection, perforation, tamponade 9, kidney injury, bleeding complications, need for emergent surgery. Given his active lifestyle, he wants to be able to do more than just 3-4 minutes of activity before having to stop. He has tried medical therapy since June 2015 without relief of symptoms.  Corky CraftsJayadeep S Kauan Kloosterman, MD  03/31/2015  9:41 AM

## 2015-03-31 NOTE — Progress Notes (Signed)
UR completed 

## 2015-03-31 NOTE — Progress Notes (Signed)
Pt/family given discharge instructions, medication lists, follow up appointments, and when to call the doctor.  Pt/family verbalizes understanding. Pt given signs and symptoms of infection. Pt/wife given information on current medications. Thomas HoffBurton, Anjanette Gilkey McClintock, RN

## 2015-03-31 NOTE — Progress Notes (Signed)
Patient Name: Ruthy DickRoger S Meyer Date of Encounter: 03/31/2015  Principal Problem:   Angina pectoris Active Problems:   Essential hypertension   CAD, multiple vessel, culprit RCA -prox with DES placed, total LCX-old, mod LAD- FFR not Hemodynamically signficant 06/06/14    Sinus bradycardia   Spontaneous hematoma of upper arm   Primary Cardiologist: Dr Allyson SabalBerry  Patient Profile: 54 yo male w/ hx DES RCA, CTO CFX, mod LAD dz, HTN, FH CAD, who had exercise-induced ischemia, abnl MV, brought in for OP cath 04/11, held overnight 2nd upper arm hematoma.   SUBJECTIVE: No chest pain or SOB. Has not had chest pain except w/ higher levels of exertion. Arm is probably a little smaller, but still sore and swollen.  OBJECTIVE Filed Vitals:   03/30/15 1530 03/30/15 1558 03/30/15 2002 03/31/15 0444  BP: 110/71 110/71 98/55 104/63  Pulse: 65 56 69 58  Temp:  97.7 F (36.5 C) 97.6 F (36.4 C) 98.2 F (36.8 C)  TempSrc:  Oral Oral Oral  Resp: 16 18 18 18   Height:      Weight:      SpO2: 98% 98% 97% 97%    Intake/Output Summary (Last 24 hours) at 03/31/15 0935 Last data filed at 03/31/15 0900  Gross per 24 hour  Intake    480 ml  Output      0 ml  Net    480 ml   Filed Weights   03/30/15 0724  Weight: 192 lb (87.091 kg)    PHYSICAL EXAM General: Well developed, well nourished, male in no acute distress. Head: Normocephalic, atraumatic.  Neck: Supple without bruits, JVD not elevated. Lungs:  Resp regular and unlabored, CTA. Heart: RRR, S1, S2, no S3, S4, or murmur; no rub. Abdomen: Soft, non-tender, non-distended, BS + x 4.  Extremities: No clubbing, cyanosis, no edema. Right radial cath site without ecchymosis or hematoma. R upper arm w/ hematoma palpable but smaller than previous lines drawn.  Tender. Good distal pulses.   Neuro: Alert and oriented X 3. Moves all extremities spontaneously. Psych: Normal affect.  LABS: CBC:  Recent Labs  03/31/15 0440  WBC 10.2  HGB 14.2    HCT 42.5  MCV 88.9  PLT 183   Basic Metabolic Panel:  Recent Labs  16/09/9603/12/16 0440  NA 139  K 4.1  CL 107  CO2 25  GLUCOSE 110*  BUN 15  CREATININE 0.98  CALCIUM 8.7   TELE:  SR, S brady high 40s at times  Current Medications:  . amLODipine  5 mg Oral Daily  . aspirin EC  81 mg Oral Daily  . atorvastatin  80 mg Oral q1800  . clopidogrel  75 mg Oral Q breakfast  . irbesartan  150 mg Oral Daily  . sodium chloride  3 mL Intravenous Q12H  . sodium chloride  3 mL Intravenous Q12H      ASSESSMENT AND PLAN: Principal Problem:   Angina pectoris - continue ASA, high-dose statin, Plavix.  - no BB 2nd underlying bradycardia - on amlodipine - will add low-dose Imdur and see how tolerated  Active Problems:   Essential hypertension - good control on current rx    CAD, multiple vessel, culprit RCA -prox with DES placed, total LCX-old, mod LAD- FFR not Hemodynamically signficant 06/06/14  - see above    Sinus bradycardia - no rate-lowering rx - asymptomatic - follow    hematoma upper arm - likely due to a small puncture from the radial cath  -  happened after cath - H&H stable - limit use of R arm x 2 weeks, pt will need to be out of work  Plan - add Imdur, ambulate, possible d/c today. See in office w/in 2 weeks, Dr Eldridge Dace to decide on PCI of CTO CFX  Signed, Theodore Demark , PA-C 9:35 AM 03/31/2015  Attending Note:   The patient was seen and examined.  Agree with assessment and plan as noted above.  Changes made to the above note as needed.  Pt has exertional angina.  His cath was complicated by a right upper arm hematoma.  I think we need to wait until this heals before we attempt opening the CTO. Will DC to home and have him see Dr. Allyson Sabal in several weeks.  Will need a note excusing him from work for a week.    Vesta Mixer, Montez Hageman., MD, [facility] 03/31/2015, 10:56 AM [address],  [address] Office [phone] Pager [phone]

## 2015-03-31 NOTE — Discharge Summary (Signed)
CARDIOLOGY DISCHARGE SUMMARY   Patient ID: Tyler Holder MRN: [id number] DOB/AGE: Nov 03, 1961 54 y.o.  Admit date: 03/30/2015 Discharge date: 03/31/2015  PCP: Leanor RubensteinSUN,VYVYAN Y, MD Primary Cardiologist: Dr. Allyson SabalBerry  Primary Discharge Diagnosis:  Angina pectoris Secondary Discharge Diagnosis:    Essential hypertension   CAD, multiple vessel, culprit RCA -prox with DES placed, total LCX-old, mod LAD- FFR not Hemodynamically signficant 06/06/14    Sinus bradycardia   Spontaneous hematoma of upper arm  Procedures: Cardiac catheterization, coronary arteriogram, left ventriculogram  Hospital Course: Tyler DickRoger S Luckadoo is a 54 y.o. male with a history of CAD. His circumflex was occluded on previous cath in 2015. He had a DES to the RCA and moderate LAD disease that was not significant by FFR. He was experiencing some chest pain with exertion and had a recent Myoview that showed ischemia in the circumflex territory. He was scheduled for cardiac catheterization and came to the hospital for the procedure on 03/30/2015.  Cardiac catheterization results are below. Dr. Gery PrayBarry reviewed the films and felt that consideration of PCI of his CTO circumflex was the best option, and this could be done as an outpatient.  After his cardiac catheterization, he was noted to have pain and swelling in his right upper arm. The hematoma was palpable. The blood pressure cuff was inflated and used to control this as the site was too large for manual compression. The patient was treated with pain control medications and tolerated this well. He was held overnight.  On 03/31/2015, his hemoglobin and hematocrit were stable. His renal function was stable as well. The hematoma although present, was smaller in size.  He was seen by Dr. Elease HashimotoNahser and all data were reviewed. With the right upper extremity hematoma, PCI to the CFX is deferred. As he is not having resting chest pain, medical therapy is recommended for CAD for now. He is to  continue on his aspirin, statin, and Plavix. He is not on a beta blocker due to underlying sinus bradycardia with a heart rate in the high 40s at times. He is on amlodipine at 5 mg and his blood pressure will not allow up titration of this. We will add Imdur to his medication regimen at a low dose and see how he tolerates it. No further inpatient workup is recommended at this time and he is considered stable for discharge, to follow-up as an outpatient.  Labs:   Lab Results  Component Value Date   WBC 10.2 03/31/2015   HGB 14.2 03/31/2015   HCT 42.5 03/31/2015   MCV 88.9 03/31/2015   PLT 183 03/31/2015     Recent Labs Lab 03/31/15 0440  NA 139  K 4.1  CL 107  CO2 25  BUN 15  CREATININE 0.98  CALCIUM 8.7  GLUCOSE 110*   Cardiac Cath: 03/30/2015 ANGIOGRAPHIC RESULTS:  1. Left main; normal  2. LAD; 60% smooth mid 3. Left circumflex; nondominant with chronic total occlusion proximally with bridging collaterals. This is not changed since his prior cath. He does have a moderate to large ramus branch as well which is free of significant disease.  4. Right coronary artery; dominant with widely patent stent in the proximal portion and mild disease in the midportion. This gave off collaterals until the circumflex CTO 5. Left ventriculography; RAO left ventriculogram was performed using  25 mL of Visipaque dye at 12 mL/second. The overall LVEF estimated  60 % Without wall motion abnormalities IMPRESSION:Mr. Nedra HaiLee has unchanged anatomy compared to  his last cath and intervention in July of last year. His RCA stent is widely patent. His mobilities stenosis is moderate and unchanged from his prior cath. In addition, his ischemia within the circumflex territory. He has a chronic total occlusion in the proximal circumflex. His symptoms are mostly during strenuous exercise. His Myoview abnormalities in the circumflex territory. He may benefit from percutaneous intervention on the chronic total  occlusion. At this point, we will continue to follow him medically and I will refer him to Dr. Eldridge Dace to review his percutaneous options. The sheath was removed and a TR band was placed on the right wrist to achieve patent hemostasis. The patient left the lab in stable condition. He'll be discharged home after several hours and will see mid-level provider back in one to 2 weeks on a daily I'm in the office.  EKG: Sinus bradycardia, no acute ischemic changes  FOLLOW UP PLANS AND APPOINTMENTS Allergies  Allergen Reactions  . Lisinopril Cough     Medication List    TAKE these medications        ALPRAZolam 0.25 MG tablet  Commonly known as:  XANAX  Take 1 tablet (0.25 mg total) by mouth daily as needed for anxiety.     amLODipine 5 MG tablet  Commonly known as:  NORVASC  Take 1 tablet (5 mg total) by mouth daily.     aspirin 81 MG tablet  Take 81 mg by mouth daily.     atorvastatin 80 MG tablet  Commonly known as:  LIPITOR  TAKE 1 TABLET BY MOUTH DAILY AT 6 PM     clopidogrel 75 MG tablet  Commonly known as:  PLAVIX  Take 1 tablet (75 mg total) by mouth daily with breakfast.     isosorbide mononitrate 30 MG 24 hr tablet  Commonly known as:  IMDUR  Take 0.5 tablets (15 mg total) by mouth daily.     nitroGLYCERIN 0.4 MG SL tablet  Commonly known as:  NITROSTAT  Place 1 tablet (0.4 mg total) under the tongue every 5 (five) minutes x 3 doses as needed for chest pain.     oxyCODONE-acetaminophen 5-325 MG per tablet  Commonly known as:  PERCOCET/ROXICET  Take 1 tablet by mouth every 4 (four) hours as needed for moderate pain or severe pain.     valsartan 160 MG tablet  Commonly known as:  DIOVAN  Take 1 tablet (160 mg total) by mouth daily.        Discharge Instructions    Diet - low sodium heart healthy    Complete by:  As directed      Increase activity slowly    Complete by:  As directed           Follow-up Information    Follow up with Runell Gess, MD.    Specialty:  Cardiology   Contact information:   [address] [place] [address] [phone]       BRING ALL MEDICATIONS WITH YOU TO FOLLOW UP APPOINTMENTS  Time spent with patient to include physician time: 41 min Signed: Theodore Demark, PA-C 03/31/2015, 11:57 AM Co-Sign MD  Attending Note:   The patient was seen and examined.  Agree with assessment and plan as noted above.  Changes made to the above note as needed.  See my note from day of discharge.   Vesta Mixer, Montez Hageman., MD, [facility] 04/01/2015, 6:35 PM [address]  [address] Office [phone] Pager [phone]

## 2015-04-01 ENCOUNTER — Telehealth: Payer: Self-pay | Admitting: Cardiovascular Disease

## 2015-04-01 SURGERY — PERCUTANEOUS CORONARY STENT INTERVENTION (PCI-S)
Anesthesia: LOCAL

## 2015-04-01 NOTE — Telephone Encounter (Signed)
Closed encounter °

## 2015-04-06 ENCOUNTER — Encounter: Payer: Self-pay | Admitting: *Deleted

## 2015-05-05 ENCOUNTER — Ambulatory Visit (INDEPENDENT_AMBULATORY_CARE_PROVIDER_SITE_OTHER): Payer: BLUE CROSS/BLUE SHIELD | Admitting: Cardiology

## 2015-05-05 ENCOUNTER — Encounter: Payer: Self-pay | Admitting: Interventional Cardiology

## 2015-05-05 ENCOUNTER — Encounter: Payer: Self-pay | Admitting: Cardiology

## 2015-05-05 VITALS — BP 138/94 | HR 76 | Ht 69.5 in | Wt 200.8 lb

## 2015-05-05 DIAGNOSIS — I10 Essential (primary) hypertension: Secondary | ICD-10-CM | POA: Diagnosis not present

## 2015-05-05 DIAGNOSIS — I208 Other forms of angina pectoris: Secondary | ICD-10-CM

## 2015-05-05 DIAGNOSIS — I2510 Atherosclerotic heart disease of native coronary artery without angina pectoris: Secondary | ICD-10-CM

## 2015-05-05 DIAGNOSIS — E785 Hyperlipidemia, unspecified: Secondary | ICD-10-CM

## 2015-05-05 DIAGNOSIS — I2089 Other forms of angina pectoris: Secondary | ICD-10-CM

## 2015-05-05 MED ORDER — RANOLAZINE ER 500 MG PO TB12: 500.0000 mg | ORAL_TABLET | Freq: Two times a day (BID) | ORAL | Status: DC

## 2015-05-05 NOTE — Patient Instructions (Addendum)
Your physician has requested that you have a cardiac catheterization. Cardiac catheterization is used to diagnose and/or treat various heart conditions. Doctors may recommend this procedure for a number of different reasons. The most common reason is to evaluate chest pain. Chest pain can be a symptom of coronary artery disease (CAD), and cardiac catheterization can show whether plaque is narrowing or blocking your heart's arteries. This procedure is also used to evaluate the valves, as well as measure the blood flow and oxygen levels in different parts of your heart. For further information please visit [email or web address]. Please follow instruction sheet, as given.CTO OF CIRCUMFLEX WITH DR Eldridge DaceVARANASI- SCHEDULE AFTER June 20TH   Your physician recommends that you return for lab work in: 7 DAYS PRIOR TO PROCEDURE  START RANEXA 500 MG ONE TABLET TWICE DAILY

## 2015-05-05 NOTE — Progress Notes (Signed)
Cardiology Office Note   Date:  05/05/2015   ID:  JAHSHAWN SCHULTEIS, DOB 12-03-1961, MRN [id number]  PCP:  Leanor Rubenstein, MD  Cardiologist:  Dr. Allyson Sabal    Chief Complaint  Patient presents with  . Fatigue    CONT TO HAVE FATIGUE      History of Present Illness: Tyler Holder is a 54 y.o. male who presents for post PCI and now recurrent pain, unable to tolerate Imdur.  He has CTO of LCX and plan was to proceed with intervention with Dr. Abe People once his rt brachial hematoma was resolved.  Today he continues with chest pain with exertion.  If he were to walk rapidly to elevator he would develop angina associated with SOB.  No nausea, diaphoresis.  At work he has cut back by not lifting boxes.  His hematoma has resolved.      Previous history was seen in the office 07/15/14 and since that time he's had intervention by Dr. Bevelyn Buckles on the dominant RCA using a drug-eluting stent (3.5 mm x 18 mm).This pain is new onset and began 7-8 weeks ago. It waxes and wanes. It radiated to his throat and left upper extremity. His cardiac risk factors include hypertension and family history (father died of myocardial infarction in his early 26s). He had a Myoview stress test performed/18/15 that was high risk and underwent cardiac catheterization the following day by Dr. Eldridge Dace revealing three-vessel disease. His LAD was intermediate in severity and was not be significant by FFR. The circumflex coronary artery was nondominant and chronically occluded. The RCA was dominant and had a high-grade lesion which was stented with a drug-eluting stent (3.5 mm x 18 mm long resolute). The circumflex did receive collaterals from the RCA. After his stent procedure if symptoms improved however recently he's had exercise-induced chest heaviness with a Myoview stress test that showed inferolateral ischemia.  Pt was scheduled for re-cath --on cath LCX nondominant with chronic total occlusion proximally with bridging collaterals. This  is not changed since his prior cath. He does have a moderate to large ramus branch as well which is free of significant disease --post cath he developed a brachial hematoma and monitored and this has since resolved.  Was to be seen to evaluate for procedure with Dr. Eldridge Dace to decide on PCI of CTO CFX.     Past Medical History  Diagnosis Date  . Hypertension   . Chest pain   . Family history of heart disease   . Abnormal nuclear stress test 06/07/2014  . Hyperlipidemia LDL goal <70 06/07/2014  . CAD, multiple vessel, culprit RCA -prox with DES placed, total LCX-old, mod LAD- FFR not Hemodynamically sign.   06/07/2014  . Sinus bradycardia     Past Surgical History  Procedure Laterality Date  . Coronary stent placement  06/06/14    DES to Prox RCA  . Left heart catheterization with coronary angiogram N/A 06/06/2014    Procedure: LEFT HEART CATHETERIZATION WITH CORONARY ANGIOGRAM;  Surgeon: Corky Crafts, MD;  Location: [facility] CATH LAB;  Service: Cardiovascular;  Laterality: N/A;  . Percutaneous coronary stent intervention (pci-s)  06/06/2014    Procedure: PERCUTANEOUS CORONARY STENT INTERVENTION (PCI-S);  Surgeon: Corky Crafts, MD;  Location: [facility] CATH LAB;  Service: Cardiovascular;;  . Coronary angioplasty with stent placement  06/06/2014  . Cardiac catheterization  03/30/2015  . Left heart catheterization with coronary angiogram N/A 03/30/2015    Procedure: LEFT HEART CATHETERIZATION WITH CORONARY ANGIOGRAM;  Surgeon: Christiane Ha  Erlene QuanJ Berry, MD;  Location: [facility] CATH LAB;  Service: Cardiovascular;  Laterality: N/A;     Current Outpatient Prescriptions  Medication Sig Dispense Refill  . ALPRAZolam (XANAX) 0.25 MG tablet Take 1 tablet (0.25 mg total) by mouth daily as needed for anxiety. 30 tablet 0  . amLODipine (NORVASC) 5 MG tablet Take 1 tablet (5 mg total) by mouth daily. 180 tablet 3  . aspirin 81 MG tablet Take 81 mg by mouth daily.    [name]. atorvastatin (LIPITOR) 80 MG tablet TAKE 1 TABLET  BY MOUTH DAILY AT 6 PM 30 tablet 6  . clopidogrel (PLAVIX) 75 MG tablet Take 1 tablet (75 mg total) by mouth daily with breakfast. 30 tablet 11  . nitroGLYCERIN (NITROSTAT) 0.4 MG SL tablet Place 1 tablet (0.4 mg total) under the tongue every 5 (five) minutes x 3 doses as needed for chest pain. 25 tablet 4  . oxyCODONE-acetaminophen (PERCOCET/ROXICET) 5-325 MG per tablet Take 1 tablet by mouth every 4 (four) hours as needed for moderate pain or severe pain. 15 tablet 0  . ranolazine (RANEXA) 500 MG 12 hr tablet Take 1 tablet (500 mg total) by mouth 2 (two) times daily. 60 tablet 11  . valsartan (DIOVAN) 160 MG tablet Take 1 tablet (160 mg total) by mouth daily. 30 tablet 6   No current facility-administered medications for this visit.    Allergies:   Imdur and Lisinopril    Social History:  The patient  reports that he has never smoked. He has never used smokeless tobacco. He reports that he does not drink alcohol or use illicit drugs.   Family History:  The patient's family history includes Arrhythmia in his mother; Healthy in his sister; Heart attack (age of onset: 8962) in his father.    ROS:  General:no colds or fevers,  weight increase Skin:no rashes or ulcers HEENT:no blurred vision, no congestion CV:see HPI PUL:see HPI GI:no diarrhea constipation or melena, no indigestion GU:no hematuria, no dysuria MS:no joint pain, no claudication Neuro:no syncope, no lightheadedness Endo:no diabetes, no thyroid disease   Wt Readings from Last 3 Encounters:  05/05/15 200 lb 12.8 oz (91.082 kg)  03/30/15 192 lb (87.091 kg)  03/24/15 192 lb (87.091 kg)     PHYSICAL EXAM: VS:  BP 138/94 mmHg  Pulse 76  Ht 5' 9.5" (1.765 m)  Wt 200 lb 12.8 oz (91.082 kg)  BMI 29.24 kg/m2 , BMI Body mass index is 29.24 kg/(m^2). General:Pleasant affect, NAD Skin:Warm and dry, brisk capillary refill HEENT:normocephalic, sclera clear, mucus membranes moist Neck:supple, no JVD, no bruits  Heart:S1S2 RRR  without murmur, gallup, rub or click Lungs:clear without rales, rhonchi, or wheezes NWG:NFAOAbd:soft, non tender, + BS, do not palpate liver spleen or masses Ext:no lower ext edema, 2+ pedal pulses, 2+ radial pulses, rt arm hematoma resolved mild residual old echymosis  Neuro:alert and oriented X 3, MAE, follows commands, + facial symmetry    EKG:  EKG is ordered today. The ekg ordered today demonstrates SR no acute changes from previous.    Recent Labs: 06/05/2014: TSH 2.990 03/18/2015: ALT 44 03/31/2015: BUN 15; Creatinine 0.98; Hemoglobin 14.2; Platelets 183; Potassium 4.1; Sodium 139    Lipid Panel    Component Value Date/Time   CHOL 85 03/18/2015 1006   TRIG 114 03/18/2015 1006   HDL 27* 03/18/2015 1006   CHOLHDL 3.1 03/18/2015 1006   VLDL 23 03/18/2015 1006   LDLCALC 35 03/18/2015 1006       Other studies Reviewed:  Additional studies/ records that were reviewed today include:cardiac caths, labs.   ASSESSMENT AND PLAN:  1.  Continued angina, intolerant to Imdur with headache, adding Ranexa 500 mg BID today, may titrate up as needed.  2. CAD, multiple vessel, culprit RCA -prox with DES placed, total LCX-old, mod LAD- FFR not Hemodynamically signficant 06/06/14  History of CAD status post RCA stenting by Dr. Eldridge DaceVaranasi in July last year. He denied critical LAD disease and an occluded nondominant circumflex. The circumflex received collaterals from the RCA. He enjoyed clinical improvement post stenting however recently he's had exercise-induced chest heaviness and a Myoview stress test performed showed inferolateral ischemia with some ST segment depression in recovery. Based on this, outpatient diagnostic coronary arteriography  Was done to define his anatomy and rule out progression of disease. Cath was stable and thought was to proceed with POBA to the CTO of LCX.  Dr. Allyson SabalBerry discussed with him and we planned to arrange appt with Dr. Eldridge DaceVaranasi but pt prefers to just proceed with procedure  aft June 20th.  Dr. Allyson SabalBerry did discuss with pt and his wife.  Hypertension History of hypertension with blood pressure measured at 138/94. He is on amlodipine and valsartan. Continue current meds at current dosing with the addition of ranexa.  If BP stays elevated would increase norvasc.     Hyperlipidemia LDL goal <70 History of hyperlipidemia on atorvastatin 80 mg a day with recent lipid profile performed 03/18/15 revealed a total cholesterol 85, LDL 35 and HDL of 27.    Current medicines are reviewed with the patient today.  The patient Has no concerns regarding medicines.  The following changes have been made:  See above Labs/ tests ordered today include:see above  Disposition:   FU:  see above  Signed, Leone BrandINGOLD,Tyler Portnoy Holder, Tyler Holder  05/05/2015 12:12 PM    [facility] [address], [place], [place]  [address]/ [address] [place], [place] Phone: [phone]; Fax: [phone]  [phone]

## 2015-05-07 ENCOUNTER — Other Ambulatory Visit: Payer: Self-pay | Admitting: *Deleted

## 2015-05-07 DIAGNOSIS — Z01818 Encounter for other preprocedural examination: Secondary | ICD-10-CM

## 2015-05-11 ENCOUNTER — Telehealth: Payer: Self-pay | Admitting: Interventional Cardiology

## 2015-05-11 NOTE — Telephone Encounter (Signed)
Spoke with pt and informed him that his current CTO is scheduled on a non-CTO day and that we would need to reschedule it. Informed pt that available CTO days are 06/03/15 and 08/12/15. Pt verbalized understanding and agreed to call me back with a decision on which day after speaking with his wife.

## 2015-05-11 NOTE — Addendum Note (Signed)
Addended by: Neta EhlersRUITT, ANGELA M on: 05/11/2015 11:22 AM   Modules accepted: Orders

## 2015-05-11 NOTE — Telephone Encounter (Signed)
Follow up      Calling to give a new date to have procedure.  06-03-15 is a good day.  Not sure of time?  Please call and let him know if this day is ok and the time.  OK to leave a vm if he is out

## 2015-05-11 NOTE — Telephone Encounter (Signed)
Called and informed pt that I got him switched over to 06/03/15 at 8:30AM. Pt verbalized understanding and was in agreement with this plan.

## 2015-05-25 ENCOUNTER — Other Ambulatory Visit: Payer: Self-pay | Admitting: Interventional Cardiology

## 2015-05-25 DIAGNOSIS — I209 Angina pectoris, unspecified: Secondary | ICD-10-CM

## 2015-05-26 LAB — CBC
HCT: 43.6 % (ref 39.0–52.0)
HEMOGLOBIN: 14.7 g/dL (ref 13.0–17.0)
MCH: 29.5 pg (ref 26.0–34.0)
MCHC: 33.7 g/dL (ref 30.0–36.0)
MCV: 87.4 fL (ref 78.0–100.0)
MPV: 11.1 fL (ref 8.6–12.4)
PLATELETS: 208 10*3/uL (ref 150–400)
RBC: 4.99 MIL/uL (ref 4.22–5.81)
RDW: 13.7 % (ref 11.5–15.5)
WBC: 8.1 10*3/uL (ref 4.0–10.5)

## 2015-05-27 ENCOUNTER — Encounter: Payer: Self-pay | Admitting: *Deleted

## 2015-05-27 LAB — PROTIME-INR
INR: 1 (ref <1.50)
PROTHROMBIN TIME: 13.2 s (ref 11.6–15.2)

## 2015-05-27 LAB — BASIC METABOLIC PANEL WITH GFR
BUN: 14 mg/dL (ref 6–23)
CHLORIDE: 104 meq/L (ref 96–112)
CO2: 27 mEq/L (ref 19–32)
CREATININE: 1.05 mg/dL (ref 0.50–1.35)
Calcium: 9.5 mg/dL (ref 8.4–10.5)
GFR, EST NON AFRICAN AMERICAN: 80 mL/min
Glucose, Bld: 93 mg/dL (ref 70–99)
Potassium: 4.3 mEq/L (ref 3.5–5.3)
SODIUM: 139 meq/L (ref 135–145)

## 2015-05-28 ENCOUNTER — Other Ambulatory Visit: Payer: Self-pay | Admitting: Cardiology

## 2015-06-03 ENCOUNTER — Encounter (HOSPITAL_COMMUNITY)
Admission: RE | Disposition: A | Discharge status: Home / Self Care | Payer: BLUE CROSS/BLUE SHIELD | Source: Ambulatory Visit | Attending: Interventional Cardiology

## 2015-06-03 ENCOUNTER — Encounter (HOSPITAL_COMMUNITY): Payer: Self-pay | Admitting: Interventional Cardiology

## 2015-06-03 ENCOUNTER — Ambulatory Visit (HOSPITAL_COMMUNITY)
Admission: RE | Admit: 2015-06-03 | Discharge: 2015-06-04 | Disposition: A | Discharge status: Home / Self Care | Payer: BLUE CROSS/BLUE SHIELD | Source: Ambulatory Visit | Attending: Interventional Cardiology | Admitting: Interventional Cardiology

## 2015-06-03 DIAGNOSIS — Z8249 Family history of ischemic heart disease and other diseases of the circulatory system: Secondary | ICD-10-CM | POA: Diagnosis not present

## 2015-06-03 DIAGNOSIS — I10 Essential (primary) hypertension: Secondary | ICD-10-CM | POA: Diagnosis not present

## 2015-06-03 DIAGNOSIS — E785 Hyperlipidemia, unspecified: Secondary | ICD-10-CM | POA: Insufficient documentation

## 2015-06-03 DIAGNOSIS — Z955 Presence of coronary angioplasty implant and graft: Secondary | ICD-10-CM | POA: Insufficient documentation

## 2015-06-03 DIAGNOSIS — I25119 Atherosclerotic heart disease of native coronary artery with unspecified angina pectoris: Secondary | ICD-10-CM | POA: Diagnosis not present

## 2015-06-03 DIAGNOSIS — Z7902 Long term (current) use of antithrombotics/antiplatelets: Secondary | ICD-10-CM | POA: Insufficient documentation

## 2015-06-03 DIAGNOSIS — I209 Angina pectoris, unspecified: Secondary | ICD-10-CM | POA: Diagnosis not present

## 2015-06-03 DIAGNOSIS — I2582 Chronic total occlusion of coronary artery: Secondary | ICD-10-CM | POA: Diagnosis not present

## 2015-06-03 DIAGNOSIS — Z01818 Encounter for other preprocedural examination: Secondary | ICD-10-CM

## 2015-06-03 DIAGNOSIS — Z7982 Long term (current) use of aspirin: Secondary | ICD-10-CM | POA: Diagnosis not present

## 2015-06-03 HISTORY — PX: CARDIAC CATHETERIZATION: SHX172

## 2015-06-03 LAB — POCT ACTIVATED CLOTTING TIME
ACTIVATED CLOTTING TIME: 239 s
ACTIVATED CLOTTING TIME: 257 s
ACTIVATED CLOTTING TIME: 282 s
ACTIVATED CLOTTING TIME: 239 s
ACTIVATED CLOTTING TIME: 282 s
Activated Clotting Time: 270 seconds
Activated Clotting Time: 257 seconds
Activated Clotting Time: 257 seconds
Activated Clotting Time: 214 seconds

## 2015-06-03 SURGERY — CORONARY CTO INTERVENTION
Anesthesia: LOCAL

## 2015-06-03 MED ORDER — ASPIRIN EC 81 MG PO TBEC: 81.0000 mg | DELAYED_RELEASE_TABLET | Freq: Every day | ORAL | Status: DC

## 2015-06-03 MED ORDER — CLOPIDOGREL BISULFATE 75 MG PO TABS
75.0000 mg | ORAL_TABLET | Freq: Every day | ORAL | Status: DC
Start: 2015-06-04 — End: 2015-06-04
  Administered 2015-06-04: 09:00:00 75 mg via ORAL
  Filled 2015-06-03: qty 1

## 2015-06-03 MED ORDER — SODIUM CHLORIDE 0.9 % IJ SOLN: 3.0000 mL | INTRAMUSCULAR | Status: DC | PRN

## 2015-06-03 MED ORDER — MIDAZOLAM HCL 2 MG/2ML IJ SOLN
INTRAMUSCULAR | Status: AC
  Filled 2015-06-03: qty 2

## 2015-06-03 MED ORDER — CLOPIDOGREL BISULFATE 75 MG PO TABS: 75.0000 mg | ORAL_TABLET | Freq: Every day | ORAL | Status: DC

## 2015-06-03 MED ORDER — HEPARIN SODIUM (PORCINE) 1000 UNIT/ML IJ SOLN
INTRAMUSCULAR | Status: DC | PRN
  Administered 2015-06-03: 7000 [IU] via INTRAVENOUS
  Administered 2015-06-03: 3000 [IU] via INTRAVENOUS
  Administered 2015-06-03: 2000 [IU] via INTRAVENOUS
  Administered 2015-06-03 (×2): 3000 [IU] via INTRAVENOUS
  Administered 2015-06-03: 2000 [IU] via INTRAVENOUS

## 2015-06-03 MED ORDER — ASPIRIN 81 MG PO CHEW
CHEWABLE_TABLET | ORAL | Status: AC
  Filled 2015-06-03: qty 1

## 2015-06-03 MED ORDER — ONDANSETRON HCL 4 MG/2ML IJ SOLN
INTRAMUSCULAR | Status: AC
  Filled 2015-06-03: qty 2

## 2015-06-03 MED ORDER — SODIUM CHLORIDE 0.9 % WEIGHT BASED INFUSION: 1.0000 mL/kg/h | INTRAVENOUS | Status: DC

## 2015-06-03 MED ORDER — ATROPINE SULFATE 0.1 MG/ML IJ SOLN
INTRAMUSCULAR | Status: AC
  Filled 2015-06-03: qty 10

## 2015-06-03 MED ORDER — IOHEXOL 300 MG/ML  SOLN
INTRAMUSCULAR | Status: DC | PRN
  Administered 2015-06-03: 290 mL via INTRAVENOUS

## 2015-06-03 MED ORDER — NITROGLYCERIN 0.4 MG SL SUBL: 0.4000 mg | SUBLINGUAL_TABLET | SUBLINGUAL | Status: DC | PRN

## 2015-06-03 MED ORDER — ASPIRIN 81 MG PO CHEW
81.0000 mg | CHEWABLE_TABLET | Freq: Every day | ORAL | Status: DC
  Administered 2015-06-04: 09:00:00 81 mg via ORAL
  Filled 2015-06-03: qty 1

## 2015-06-03 MED ORDER — NITROGLYCERIN 1 MG/10 ML FOR IR/CATH LAB
INTRA_ARTERIAL | Status: AC
  Filled 2015-06-03: qty 10

## 2015-06-03 MED ORDER — CLOPIDOGREL BISULFATE 75 MG PO TABS: 75.0000 mg | ORAL_TABLET | ORAL | Status: DC

## 2015-06-03 MED ORDER — CLOPIDOGREL BISULFATE 300 MG PO TABS
ORAL_TABLET | ORAL | Status: AC
  Filled 2015-06-03: qty 1

## 2015-06-03 MED ORDER — NITROGLYCERIN 0.2 MG/ML ON CALL CATH LAB
INTRAVENOUS | Status: DC | PRN
  Administered 2015-06-03: 200 ug via INTRACORONARY

## 2015-06-03 MED ORDER — ACETAMINOPHEN 500 MG PO TABS
500.0000 mg | ORAL_TABLET | Freq: Four times a day (QID) | ORAL | Status: DC | PRN
  Administered 2015-06-03: 21:00:00 500 mg via ORAL
  Filled 2015-06-03: qty 1

## 2015-06-03 MED ORDER — SODIUM CHLORIDE 0.9 % WEIGHT BASED INFUSION
3.0000 mL/kg/h | INTRAVENOUS | Status: DC
  Administered 2015-06-03: 3 mL/kg/h via INTRAVENOUS

## 2015-06-03 MED ORDER — FENTANYL CITRATE (PF) 100 MCG/2ML IJ SOLN
INTRAMUSCULAR | Status: AC
  Filled 2015-06-03: qty 2

## 2015-06-03 MED ORDER — CLOPIDOGREL BISULFATE 300 MG PO TABS
ORAL_TABLET | ORAL | Status: DC | PRN
  Administered 2015-06-03: 300 mg via ORAL

## 2015-06-03 MED ORDER — ALPRAZOLAM 0.25 MG PO TABS
0.2500 mg | ORAL_TABLET | Freq: Every day | ORAL | Status: DC | PRN
  Administered 2015-06-03: 0.25 mg via ORAL
  Filled 2015-06-03: qty 1

## 2015-06-03 MED ORDER — SODIUM CHLORIDE 0.9 % IJ SOLN: 3.0000 mL | Freq: Two times a day (BID) | INTRAMUSCULAR | Status: DC

## 2015-06-03 MED ORDER — SODIUM CHLORIDE 0.9 % IV SOLN: 250.0000 mL | INTRAVENOUS | Status: DC | PRN

## 2015-06-03 MED ORDER — HEPARIN (PORCINE) IN NACL 2-0.9 UNIT/ML-% IJ SOLN
INTRAMUSCULAR | Status: AC
  Filled 2015-06-03: qty 1500

## 2015-06-03 MED ORDER — OXYCODONE-ACETAMINOPHEN 5-325 MG PO TABS
1.0000 | ORAL_TABLET | ORAL | Status: DC | PRN
  Administered 2015-06-03: 15:00:00 1 via ORAL
  Filled 2015-06-03: qty 1

## 2015-06-03 MED ORDER — MIDAZOLAM HCL 2 MG/2ML IJ SOLN
INTRAMUSCULAR | Status: DC | PRN
  Administered 2015-06-03: 1 mg via INTRAVENOUS
  Administered 2015-06-03: 2 mg via INTRAVENOUS
  Administered 2015-06-03 (×3): 1 mg via INTRAVENOUS

## 2015-06-03 MED ORDER — SODIUM CHLORIDE 0.9 % IV SOLN: INTRAVENOUS | Status: DC

## 2015-06-03 MED ORDER — ASPIRIN 81 MG PO CHEW: 81.0000 mg | CHEWABLE_TABLET | ORAL | Status: DC

## 2015-06-03 MED ORDER — SODIUM CHLORIDE 0.9 % WEIGHT BASED INFUSION: 3.0000 mL/kg/h | INTRAVENOUS | Status: AC

## 2015-06-03 MED ORDER — ASPIRIN 81 MG PO CHEW
81.0000 mg | CHEWABLE_TABLET | ORAL | Status: AC
  Administered 2015-06-03: 81 mg via ORAL

## 2015-06-03 MED ORDER — ACETAMINOPHEN 325 MG PO TABS: 650.0000 mg | ORAL_TABLET | ORAL | Status: DC | PRN

## 2015-06-03 MED ORDER — ATORVASTATIN CALCIUM 80 MG PO TABS: 80.0000 mg | ORAL_TABLET | Freq: Every day | ORAL | Status: DC

## 2015-06-03 MED ORDER — HEPARIN SODIUM (PORCINE) 1000 UNIT/ML IJ SOLN
INTRAMUSCULAR | Status: AC
  Filled 2015-06-03: qty 1

## 2015-06-03 MED ORDER — LIDOCAINE HCL (PF) 1 % IJ SOLN
INTRAMUSCULAR | Status: AC
  Filled 2015-06-03: qty 30

## 2015-06-03 MED ORDER — SODIUM CHLORIDE 0.9 % WEIGHT BASED INFUSION: 3.0000 mL/kg/h | INTRAVENOUS | Status: DC

## 2015-06-03 MED ORDER — FENTANYL CITRATE (PF) 100 MCG/2ML IJ SOLN
INTRAMUSCULAR | Status: DC | PRN
  Administered 2015-06-03 (×4): 25 ug via INTRAVENOUS
  Administered 2015-06-03: 50 ug via INTRAVENOUS
  Administered 2015-06-03: 25 ug via INTRAVENOUS

## 2015-06-03 MED ORDER — ATROPINE SULFATE 0.1 MG/ML IJ SOLN
INTRAMUSCULAR | Status: DC | PRN
  Administered 2015-06-03: 0.5 mg via INTRAVENOUS

## 2015-06-03 MED ORDER — ONDANSETRON HCL 4 MG/2ML IJ SOLN: 4.0000 mg | Freq: Four times a day (QID) | INTRAMUSCULAR | Status: DC | PRN

## 2015-06-03 SURGICAL SUPPLY — 35 items
ANGIOSEAL 8FR (Vascular Products) ×3 IMPLANT
BALLN EMERGE MR 2.5X12 (BALLOONS) ×3
BALLN EMERGE MR 3.0X20 (BALLOONS) ×3
BALLN ~~LOC~~ EMERGE MR 2.5X12 (BALLOONS) ×3
BALLN ~~LOC~~ EMERGE MR 3.0X12 (BALLOONS) ×3
BALLOON EMERGE MR 2.5X12 (BALLOONS) ×2 IMPLANT
BALLOON EMERGE MR 3.0X20 (BALLOONS) ×2 IMPLANT
BALLOON ~~LOC~~ EMERGE MR 2.5X12 (BALLOONS) ×2 IMPLANT
BALLOON ~~LOC~~ EMERGE MR 3.0X12 (BALLOONS) ×2 IMPLANT
CATH INFINITI 5 FR 3DRC (CATHETERS) ×3 IMPLANT
CATH INFINITI 5FR AL1 (CATHETERS) ×3 IMPLANT
CATH INFINITI 5FR JL4 (CATHETERS) IMPLANT
CATH INFINITI JR4 5F (CATHETERS) ×3 IMPLANT
CATH MACH1 8F VL3.5 (CATHETERS) ×3 IMPLANT
CATH MACH1 8F VL4 (CATHETERS) ×3 IMPLANT
CATH MICRO ASAHI CORSAIR 150CM (MICROCATHETER) ×3 IMPLANT
CATH STINGRAY CTO 135CM (CATHETERS) ×3 IMPLANT
DEVICE CLOSURE ANGIOSEAL 8FR (Vascular Products) ×2 IMPLANT
DEVICE WIRE ANGIOSEAL 6FR (Vascular Products) ×3 IMPLANT
KIT ENCORE 26 ADVANTAGE (KITS) ×6 IMPLANT
KIT HEART LEFT (KITS) ×6 IMPLANT
PACK CARDIAC CATHETERIZATION (CUSTOM PROCEDURE TRAY) ×3 IMPLANT
SHEATH BRITE TIP 8FR 35CM (SHEATH) ×3 IMPLANT
SHEATH PINNACLE 6F 10CM (SHEATH) ×6 IMPLANT
SHEATH PINNACLE 8F 10CM (SHEATH) ×3 IMPLANT
STENT SYNERGY DES 2.5X16 (Permanent Stent) ×3 IMPLANT
STENT SYNERGY DES 3X28 (Permanent Stent) ×3 IMPLANT
TRANSDUCER W/STOPCOCK (MISCELLANEOUS) ×6 IMPLANT
TUBING CIL FLEX 10 FLL-RA (TUBING) ×3 IMPLANT
VALVE GUARDIAN II ~~LOC~~ HEMO (MISCELLANEOUS) ×6 IMPLANT
WIRE ASAHI FIELDER FC 180CM (WIRE) ×6 IMPLANT
WIRE ASAHI FIELDER XT 190CM (WIRE) ×3 IMPLANT
WIRE ASAHI PROWATER 180CM (WIRE) ×9 IMPLANT
WIRE EMERALD 3MM-J .035X150CM (WIRE) ×3 IMPLANT
WIRE STINGRAY 300CM (WIRE) ×3 IMPLANT

## 2015-06-03 NOTE — H&P (View-Only) (Signed)
Cardiology Office Note   Date:  05/05/2015   ID:  JAHSHAWN SCHULTEIS, DOB 12-03-1961, MRN [id number]  PCP:  Leanor Rubenstein, MD  Cardiologist:  Dr. Allyson Sabal    Chief Complaint  Patient presents with  . Fatigue    CONT TO HAVE FATIGUE      History of Present Illness: Tyler Holder is a 54 y.o. male who presents for post PCI and now recurrent pain, unable to tolerate Imdur.  He has CTO of LCX and plan was to proceed with intervention with Dr. Abe People once his rt brachial hematoma was resolved.  Today he continues with chest pain with exertion.  If he were to walk rapidly to elevator he would develop angina associated with SOB.  No nausea, diaphoresis.  At work he has cut back by not lifting boxes.  His hematoma has resolved.      Previous history was seen in the office 07/15/14 and since that time he's had intervention by Dr. Bevelyn Buckles on the dominant RCA using a drug-eluting stent (3.5 mm x 18 mm).This pain is new onset and began 7-8 weeks ago. It waxes and wanes. It radiated to his throat and left upper extremity. His cardiac risk factors include hypertension and family history (father died of myocardial infarction in his early 26s). He had a Myoview stress test performed/18/15 that was high risk and underwent cardiac catheterization the following day by Dr. Eldridge Dace revealing three-vessel disease. His LAD was intermediate in severity and was not be significant by FFR. The circumflex coronary artery was nondominant and chronically occluded. The RCA was dominant and had a high-grade lesion which was stented with a drug-eluting stent (3.5 mm x 18 mm long resolute). The circumflex did receive collaterals from the RCA. After his stent procedure if symptoms improved however recently he's had exercise-induced chest heaviness with a Myoview stress test that showed inferolateral ischemia.  Pt was scheduled for re-cath --on cath LCX nondominant with chronic total occlusion proximally with bridging collaterals. This  is not changed since his prior cath. He does have a moderate to large ramus branch as well which is free of significant disease --post cath he developed a brachial hematoma and monitored and this has since resolved.  Was to be seen to evaluate for procedure with Dr. Eldridge Dace to decide on PCI of CTO CFX.     Past Medical History  Diagnosis Date  . Hypertension   . Chest pain   . Family history of heart disease   . Abnormal nuclear stress test 06/07/2014  . Hyperlipidemia LDL goal <70 06/07/2014  . CAD, multiple vessel, culprit RCA -prox with DES placed, total LCX-old, mod LAD- FFR not Hemodynamically sign.   06/07/2014  . Sinus bradycardia     Past Surgical History  Procedure Laterality Date  . Coronary stent placement  06/06/14    DES to Prox RCA  . Left heart catheterization with coronary angiogram N/A 06/06/2014    Procedure: LEFT HEART CATHETERIZATION WITH CORONARY ANGIOGRAM;  Surgeon: Corky Crafts, MD;  Location: [facility] CATH LAB;  Service: Cardiovascular;  Laterality: N/A;  . Percutaneous coronary stent intervention (pci-s)  06/06/2014    Procedure: PERCUTANEOUS CORONARY STENT INTERVENTION (PCI-S);  Surgeon: Corky Crafts, MD;  Location: [facility] CATH LAB;  Service: Cardiovascular;;  . Coronary angioplasty with stent placement  06/06/2014  . Cardiac catheterization  03/30/2015  . Left heart catheterization with coronary angiogram N/A 03/30/2015    Procedure: LEFT HEART CATHETERIZATION WITH CORONARY ANGIOGRAM;  Surgeon: Christiane Ha  Erlene QuanJ Berry, MD;  Location: [facility] CATH LAB;  Service: Cardiovascular;  Laterality: N/A;     Current Outpatient Prescriptions  Medication Sig Dispense Refill  . ALPRAZolam (XANAX) 0.25 MG tablet Take 1 tablet (0.25 mg total) by mouth daily as needed for anxiety. 30 tablet 0  . amLODipine (NORVASC) 5 MG tablet Take 1 tablet (5 mg total) by mouth daily. 180 tablet 3  . aspirin 81 MG tablet Take 81 mg by mouth daily.    [name]. atorvastatin (LIPITOR) 80 MG tablet TAKE 1 TABLET  BY MOUTH DAILY AT 6 PM 30 tablet 6  . clopidogrel (PLAVIX) 75 MG tablet Take 1 tablet (75 mg total) by mouth daily with breakfast. 30 tablet 11  . nitroGLYCERIN (NITROSTAT) 0.4 MG SL tablet Place 1 tablet (0.4 mg total) under the tongue every 5 (five) minutes x 3 doses as needed for chest pain. 25 tablet 4  . oxyCODONE-acetaminophen (PERCOCET/ROXICET) 5-325 MG per tablet Take 1 tablet by mouth every 4 (four) hours as needed for moderate pain or severe pain. 15 tablet 0  . ranolazine (RANEXA) 500 MG 12 hr tablet Take 1 tablet (500 mg total) by mouth 2 (two) times daily. 60 tablet 11  . valsartan (DIOVAN) 160 MG tablet Take 1 tablet (160 mg total) by mouth daily. 30 tablet 6   No current facility-administered medications for this visit.    Allergies:   Imdur and Lisinopril    Social History:  The patient  reports that he has never smoked. He has never used smokeless tobacco. He reports that he does not drink alcohol or use illicit drugs.   Family History:  The patient's family history includes Arrhythmia in his mother; Healthy in his sister; Heart attack (age of onset: 8962) in his father.    ROS:  General:no colds or fevers,  weight increase Skin:no rashes or ulcers HEENT:no blurred vision, no congestion CV:see HPI PUL:see HPI GI:no diarrhea constipation or melena, no indigestion GU:no hematuria, no dysuria MS:no joint pain, no claudication Neuro:no syncope, no lightheadedness Endo:no diabetes, no thyroid disease   Wt Readings from Last 3 Encounters:  05/05/15 200 lb 12.8 oz (91.082 kg)  03/30/15 192 lb (87.091 kg)  03/24/15 192 lb (87.091 kg)     PHYSICAL EXAM: VS:  BP 138/94 mmHg  Pulse 76  Ht 5' 9.5" (1.765 m)  Wt 200 lb 12.8 oz (91.082 kg)  BMI 29.24 kg/m2 , BMI Body mass index is 29.24 kg/(m^2). General:Pleasant affect, NAD Skin:Warm and dry, brisk capillary refill HEENT:normocephalic, sclera clear, mucus membranes moist Neck:supple, no JVD, no bruits  Heart:S1S2 RRR  without murmur, gallup, rub or click Lungs:clear without rales, rhonchi, or wheezes NWG:NFAOAbd:soft, non tender, + BS, do not palpate liver spleen or masses Ext:no lower ext edema, 2+ pedal pulses, 2+ radial pulses, rt arm hematoma resolved mild residual old echymosis  Neuro:alert and oriented X 3, MAE, follows commands, + facial symmetry    EKG:  EKG is ordered today. The ekg ordered today demonstrates SR no acute changes from previous.    Recent Labs: 06/05/2014: TSH 2.990 03/18/2015: ALT 44 03/31/2015: BUN 15; Creatinine 0.98; Hemoglobin 14.2; Platelets 183; Potassium 4.1; Sodium 139    Lipid Panel    Component Value Date/Time   CHOL 85 03/18/2015 1006   TRIG 114 03/18/2015 1006   HDL 27* 03/18/2015 1006   CHOLHDL 3.1 03/18/2015 1006   VLDL 23 03/18/2015 1006   LDLCALC 35 03/18/2015 1006       Other studies Reviewed:  Additional studies/ records that were reviewed today include:cardiac caths, labs.   ASSESSMENT AND PLAN:  1.  Continued angina, intolerant to Imdur with headache, adding Ranexa 500 mg BID today, may titrate up as needed.  2. CAD, multiple vessel, culprit RCA -prox with DES placed, total LCX-old, mod LAD- FFR not Hemodynamically signficant 06/06/14  History of CAD status post RCA stenting by Dr. Varanasi in July last year. He denied critical LAD disease and an occluded nondominant circumflex. The circumflex received collaterals from the RCA. He enjoyed clinical improvement post stenting however recently he's had exercise-induced chest heaviness and a Myoview stress test performed showed inferolateral ischemia with some ST segment depression in recovery. Based on this, outpatient diagnostic coronary arteriography  Was done to define his anatomy and rule out progression of disease. Cath was stable and thought was to proceed with POBA to the CTO of LCX.  Dr. Berry discussed with him and we planned to arrange appt with Dr. Varanasi but pt prefers to just proceed with procedure  aft June 20th.  Dr. Berry did discuss with pt and his wife.  Hypertension History of hypertension with blood pressure measured at 138/94. He is on amlodipine and valsartan. Continue current meds at current dosing with the addition of ranexa.  If BP stays elevated would increase norvasc.     Hyperlipidemia LDL goal <70 History of hyperlipidemia on atorvastatin 80 mg a day with recent lipid profile performed 03/18/15 revealed a total cholesterol 85, LDL 35 and HDL of 27.    Current medicines are reviewed with the patient today.  The patient Has no concerns regarding medicines.  The following changes have been made:  See above Labs/ tests ordered today include:see above  Disposition:   FU:  see above  Signed, Tyler Germain R, NP  05/05/2015 12:12 PM    [facility] [address], [place], [place]  [address]/ [address] [place], [place] Phone: [phone]; Fax: [phone]  [phone]    

## 2015-06-03 NOTE — Interval H&P Note (Signed)
Cath Lab Visit (complete for each Cath Lab visit)  Clinical Evaluation Leading to the Procedure:   ACS: No.  Non-ACS:    Anginal Classification: CCS III  Anti-ischemic medical therapy: Maximal Therapy (2 or more classes of medications)  Non-Invasive Test Results: No non-invasive testing performed  Prior CABG: No previous CABG  Ischemic Symptoms? CCS III (Marked limitation of ordinary activity) Anti-ischemic Medical Therapy? Maximal Medical Therapy (2 or more classes of medications) Non-invasive Test Results? No non-invasive testing performed Prior CABG? No Previous CABG   Patient Information:   1-2V CAD, no prox LAD  A (7)  Indication: 20; Score: 7   Patient Information:   1-2V-CAD with DS 50-60% With No FFR, No IVUS  I (3)  Indication: 21; Score: 3   Patient Information:   1-2V-CAD with DS 50-60% With FFR  A (7)  Indication: 22; Score: 7   Patient Information:   1-2V-CAD with DS 50-60% With FFR>0.8, IVUS not significant  I (2)  Indication: 23; Score: 2   Patient Information:   3V-CAD without LMCA With Abnormal LV systolic function  A (9)  Indication: 48; Score: 9   Patient Information:   LMCA-CAD  A (9)  Indication: 49; Score: 9   Patient Information:   2V-CAD with prox LAD PCI  A (7)  Indication: 62; Score: 7   Patient Information:   2V-CAD with prox LAD CABG  A (8)  Indication: 62; Score: 8   Patient Information:   3V-CAD without LMCA With Low CAD burden(i.e., 3 focal stenoses, low SYNTAX score) PCI  A (7)  Indication: 63; Score: 7   Patient Information:   3V-CAD without LMCA With Low CAD burden(i.e., 3 focal stenoses, low SYNTAX score) CABG  A (9)  Indication: 63; Score: 9   Patient Information:   3V-CAD without LMCA E06c - Intermediate-high CAD burden (i.e., multiple diffuse lesions, presence of CTO, or high SYNTAX score) PCI  U (4)  Indication: 64; Score: 4   Patient Information:   3V-CAD without  LMCA E06c - Intermediate-high CAD burden (i.e., multiple diffuse lesions, presence of CTO, or high SYNTAX score) CABG  A (9)  Indication: 64; Score: 9   Patient Information:   LMCA-CAD With Isolated LMCA stenosis  PCI  U (6)  Indication: 65; Score: 6   Patient Information:   LMCA-CAD With Isolated LMCA stenosis  CABG  A (9)  Indication: 65; Score: 9   Patient Information:   LMCA-CAD Additional CAD, low CAD burden (i.e., 1- to 2-vessel additional involvement, low SYNTAX score) PCI  U (5)  Indication: 66; Score: 5   Patient Information:   LMCA-CAD Additional CAD, low CAD burden (i.e., 1- to 2-vessel additional involvement, low SYNTAX score) CABG  A (9)  Indication: 66; Score: 9   Patient Information:   LMCA-CAD Additional CAD, intermediate-high CAD burden (i.e., 3-vessel involvement, presence of CTO, or high SYNTAX score) PCI  I (3)  Indication: 67; Score: 3   Patient Information:   LMCA-CAD Additional CAD, intermediate-high CAD burden (i.e., 3-vessel involvement, presence of CTO, or high SYNTAX score) CABG  A (9)  Indication: 67; Score: 9    History and Physical Interval Note:  06/03/2015 8:57 AM  Tyler Holder  has presented today for surgery, with the diagnosis of known cad  The various methods of treatment have been discussed with the patient and family. After consideration of risks, benefits and other options for treatment, the patient has consented to  Procedure(s): Coronary/Bypass Graft CTO  Intervention (N/A) as a surgical intervention .  The patient's history has been reviewed, patient examined, no change in status, stable for surgery.  I have reviewed the patient's chart and labs.  Questions were answered to the patient's satisfaction.     [name] S.

## 2015-06-04 ENCOUNTER — Encounter (HOSPITAL_COMMUNITY): Payer: Self-pay | Admitting: Physician Assistant

## 2015-06-04 DIAGNOSIS — I209 Angina pectoris, unspecified: Secondary | ICD-10-CM | POA: Diagnosis not present

## 2015-06-04 DIAGNOSIS — E785 Hyperlipidemia, unspecified: Secondary | ICD-10-CM | POA: Diagnosis not present

## 2015-06-04 DIAGNOSIS — I10 Essential (primary) hypertension: Secondary | ICD-10-CM | POA: Diagnosis not present

## 2015-06-04 DIAGNOSIS — I25119 Atherosclerotic heart disease of native coronary artery with unspecified angina pectoris: Secondary | ICD-10-CM | POA: Diagnosis not present

## 2015-06-04 LAB — CBC
HEMATOCRIT: 40.7 % (ref 39.0–52.0)
HEMOGLOBIN: 13.4 g/dL (ref 13.0–17.0)
MCH: 29.5 pg (ref 26.0–34.0)
MCHC: 32.9 g/dL (ref 30.0–36.0)
MCV: 89.5 fL (ref 78.0–100.0)
Platelets: 187 10*3/uL (ref 150–400)
RBC: 4.55 MIL/uL (ref 4.22–5.81)
RDW: 13 % (ref 11.5–15.5)
WBC: 13 10*3/uL — AB (ref 4.0–10.5)

## 2015-06-04 LAB — BASIC METABOLIC PANEL
ANION GAP: 8 (ref 5–15)
BUN: 10 mg/dL (ref 6–20)
CALCIUM: 8.1 mg/dL — AB (ref 8.9–10.3)
CO2: 23 mmol/L (ref 22–32)
Chloride: 109 mmol/L (ref 101–111)
Creatinine, Ser: 1.07 mg/dL (ref 0.61–1.24)
GFR calc Af Amer: >60 mL/min (ref >60)
GFR calc non Af Amer: >60 mL/min (ref >60)
Glucose, Bld: 104 mg/dL — ABNORMAL HIGH (ref 65–99)
Potassium: 4.2 mmol/L (ref 3.5–5.1)
Sodium: 140 mmol/L (ref 135–145)

## 2015-06-04 MED FILL — Heparin Sodium (Porcine) 2 Unit/ML in Sodium Chloride 0.9%: INTRAMUSCULAR | Qty: 1500 | Status: AC

## 2015-06-04 MED FILL — Lidocaine HCl Local Preservative Free (PF) Inj 1%: INTRAMUSCULAR | Qty: 60 | Status: AC

## 2015-06-04 NOTE — Progress Notes (Signed)
Patient Name: Tyler Holder Date of Encounter: 06/04/2015   SUBJECTIVE  Denies chest pain, sob or palpitation.  Needs work note.  CURRENT MEDS . aspirin  81 mg Oral Daily  . aspirin EC  81 mg Oral QPC supper  . atorvastatin  80 mg Oral q1800  . clopidogrel  75 mg Oral Q breakfast  . clopidogrel  75 mg Oral Q breakfast  . sodium chloride  3 mL Intravenous Q12H    OBJECTIVE  Filed Vitals:   06/03/15 2035 06/03/15 2305 06/04/15 0036 06/04/15 0638  BP: 116/77 109/63  116/68  Pulse: 79 65  67  Temp: 98.2 F (36.8 C) 98.6 F (37 C)  98.3 F (36.8 C)  TempSrc: Oral Oral  Oral  Resp: '16 18  20  ' Height:      Weight:   194 lb 0.1 oz (88 kg)   SpO2: 97% 95%  95%    Intake/Output Summary (Last 24 hours) at 06/04/15 0643 Last data filed at 06/03/15 1700  Gross per 24 hour  Intake   1536 ml  Output   1450 ml  Net     86 ml   Filed Weights   06/03/15 0647 06/04/15 0036  Weight: 194 lb (87.998 kg) 194 lb 0.1 oz (88 kg)    PHYSICAL EXAM  General: Pleasant, NAD. Neuro: Alert and oriented X 3. Moves all extremities spontaneously. Psych: Normal affect. HEENT:  Normal  Neck: Supple without bruits or JVD. Lungs:  Resp regular and unlabored. Bibasilar rales. No wheezing.  Heart: RRR no s3, s4, or murmurs. Abdomen: Soft, non-tender, non-distended, BS + x 4.  Extremities: No clubbing, cyanosis or edema. DP/PT/Radials 2+ and equal bilaterally. Right and Left common femoral artery site without erythema, hematoma or bruit.   Accessory Clinical Findings  Basic Metabolic Panel  Recent Labs  06/04/15 0518  NA 140  K 4.2  CL 109  CO2 23  GLUCOSE 104*  BUN 10  CREATININE 1.07  CALCIUM 8.1*    TELE  NSR with rate of 70s to 80s.   Cath 06/03/15 Conclusion     Successful revascularization of the chronic total occlusion in the circumflex and OM1. Drug-eluting stents were placed in the circumflex and OM1 in a crush fashion, with final simultaneous kissing balloon  inflation.  Continue dual antiplatelet therapy for at least a year. He'll need aggressive secondary prevention. Would likely continue clopidogrel even longer than a year given the crush technique used during this intervention.  If no complications, anticipate discharge home tomorrow.     Coronary Findings    Dominance: Co-dominant   Left Anterior Descending   . Mid LAD lesion, 50% stenosed.   . First Diagonal Branch   The vessel is small in size.   . Third Diagonal Branch   The vessel is small in size.     Left Circumflex   . Prox Cx to Mid Cx lesion, 100% stenosed. chronic total occlusion .   [name] PCI: The pre-interventional distal flow is decreased (TIMI 1). Pre-stent angioplasty was performed. A drug-eluting stent was placed. The strut is apposed. Post-stent angioplasty was performed. Maximum pressure: 14 atm. The post-interventional distal flow is normal (TIMI 3). The intervention was successful. No complications occurred at this lesion. Pressure wire/FFR was not performed on the lesion. IVUS was not performed on the lesion.  . Supplies used: STENT SYNERGY DES [id number]  . There is no residual stenosis post intervention.     . First Obtuse Marginal  Branch   . Ost 1st Mrg lesion, 100% stenosed.   [name] PCI: The pre-interventional distal flow is decreased (TIMI 1). Pre-stent angioplasty was performed. A drug-eluting stent was placed. The strut is apposed. No post-stent angioplasty was performed. The post-interventional distal flow is normal (TIMI 3). The intervention was successful. No complications occurred at this lesion. Pressure wire/FFR was not performed on the lesion. IVUS was not performed on the lesion.  . Supplies used: STENT SYNERGY DES 2.5X16  . There is no residual stenosis post intervention.       Right Coronary Artery   . Prox RCA lesion, 0% stenosed. Previously placed Prox RCA stent (unknown type) is patent.      Radiology/Studies  No results found.  ASSESSMENT AND  PLAN Active Problems:  1.  Angina pectoris - Cath 6/16 showed patent proc RCA stent, Successful PCI of the chronic total occlusion in the circumflex and OM1. DES were placed in the circumflex (STENT SYNERGY DES 3X28) and OM1 (STENT SYNERGY DES 2.5X16)  in a crush fashion, with final simultaneous kissing balloon.  - Continue DAPT for least year, likely continue clopidogrel even longer than a year given the crush technique used during this intervention. - Continue  Lipitor 88m  2. HTN - Continue valsartan  1679m  3. HL - Continue statin.   Signed, Bhagat,Bhavinkumar PA-C Pager [phone] have examined the patient and reviewed assessment and plan and discussed with patient.  Agree with above as stated.  Doing well post PCI. Crushed stent to bifurcation of circ and OM1.  Continue longterm plavix therapy, > 1 year. Aggressive secondary prevention.  Recommended rehab. Explained to patient why statin therapy was needed.  Holding amlodipine at this time.  BP is ok off of meds.  WIll restart ARB.  If needed, amlodipine can be added back. Cardiology f/u with Dr. BeGwenlyn Found   VARANASI,JAYADEEP S.

## 2015-06-04 NOTE — Progress Notes (Signed)
CARDIAC REHAB PHASE I   PRE:  Rate/Rhythm: 76 SR    BP: sitting 122/77    SaO2:   MODE:  Ambulation: 1000 ft   POST:  Rate/Rhythm: 85 SR    BP: sitting 126/67     SaO2:   Tolerated well but does endorse sore groins. Slower pace than his norm. Ed reviewed/completed. Pt did CRPII last year and does not want to do again. Understands importance of Plavix. [id number]   Elissa Lovett [place] CES, ACSM 06/04/2015 9:31 AM

## 2015-06-04 NOTE — Discharge Summary (Signed)
Discharge Summary   Patient ID: Tyler Holder,  MRN: [id number], DOB/AGE: 1960-12-23 54 y.o.  Admit date: 06/03/2015 Discharge date: 06/04/2015  Primary Care Provider: Lynne Logan Primary Cardiologist: Dr. Irish Lack  Discharge Diagnoses Active Problems:   Angina pectoris   Allergies Allergies  Allergen Reactions  . Imdur [Isosorbide] Other (See Comments)    Headache  . Lisinopril Cough    Procedures  Cath 06/03/15 Conclusion     Successful revascularization of the chronic total occlusion in the circumflex and OM1. Drug-eluting stents were placed in the circumflex and OM1 in a crush fashion, with final simultaneous kissing balloon inflation.  Continue dual antiplatelet therapy for at least a year. He'll need aggressive secondary prevention. Would likely continue clopidogrel even longer than a year given the crush technique used during this intervention.  If no complications, anticipate discharge home tomorrow.     Coronary Findings    Dominance: Co-dominant   Left Anterior Descending   . Mid LAD lesion, 50% stenosed.   . First Diagonal Branch   The vessel is small in size.   . Third Diagonal Branch   The vessel is small in size.     Left Circumflex   . Prox Cx to Mid Cx lesion, 100% stenosed. chronic total occlusion .   [name] PCI: The pre-interventional distal flow is decreased (TIMI 1). Pre-stent angioplasty was performed. A drug-eluting stent was placed. The strut is apposed. Post-stent angioplasty was performed. Maximum pressure: 14 atm. The post-interventional distal flow is normal (TIMI 3). The intervention was successful. No complications occurred at this lesion. Pressure wire/FFR was not performed on the lesion. IVUS was not performed on the lesion.  . Supplies used: STENT SYNERGY DES [id number]  . There is no residual stenosis post intervention.     . First Obtuse Marginal Branch   . Ost 1st Mrg lesion, 100% stenosed.     [name] PCI: The pre-interventional distal flow is decreased (TIMI 1). Pre-stent angioplasty was performed. A drug-eluting stent was placed. The strut is apposed. No post-stent angioplasty was performed. The post-interventional distal flow is normal (TIMI 3). The intervention was successful. No complications occurred at this lesion. Pressure wire/FFR was not performed on the lesion. IVUS was not performed on the lesion.  . Supplies used: STENT SYNERGY DES 2.5X16  . There is no residual stenosis post intervention.       Right Coronary Artery   . Prox RCA lesion, 0% stenosed. Previously placed Prox RCA stent (unknown type) is patent.          History of Present Illness  Tyler Holder is a 54 y.o. male with a history of CAD, HTN ,HL and sinus bradycardia who presented to hospital 06/03/15 for scheduled cath. He dose HVAC for a living.   Cath 06/06/2014 culprit RCA -prox with DES placed, total LCX-old, mod LAD- FFR not Hemodynamically significant. He was doing well up until feb-march of 2016 when he began to having intermittent exertional chest pain. Myoview stress test 03/24/15 showed inferolateral ischemia. Cath 03/30/2015 showed patent RCA, chronic total occlusion in the proximal circumflex with bridging collaterals. Post cath patient developed R brachial hematoma and PCI to the CFX is deferred. He is not on a BB due to underlying sinus bradycardia with a rate in high 40s at times. Intolerance to Imdur and Lisinopril. He continued to having recurrent chest pain when seen by Cecilie Kicks, NP 05/05/15 and Renexa was added and plan to made for outpatient cath.  Hospital Course  Pt presented for scheduled cath 05/03/15. His amlodipine and Valsartan were held due to hypotension. Pre-cath labs were normal.  Cath showed patent proc RCA stent, Successful PCI of the chronic total occlusion in the circumflex and OM1. DES were placed in the circumflex (STENT SYNERGY DES 3X28) and OM1 (STENT SYNERGY DES  2.5X16) in a crush fashion, with final simultaneous kissing balloon. Right and Left common femoral artery site without erythema, hematoma or bruit.  Plan to continue DAPT for least year, likely continue clopidogrel even longer than a year. Patient remained asymptomatic post cath. Will continue to hold amlodipine during discharge and plan to add as outpatient as need. Hold Renexa. He was advice to continue valsartan. Work note is given.   Discharge Vitals Blood pressure 116/68, pulse 67, temperature 98.3 F (36.8 C), temperature source Oral, resp. rate 20, height '5\' 9"'  (1.753 m), weight 194 lb 0.1 oz (88 kg), SpO2 95 %.  Filed Weights   06/03/15 0647 06/04/15 0036  Weight: 194 lb (87.998 kg) 194 lb 0.1 oz (88 kg)    Labs  CBC  Recent Labs  06/04/15 0518  WBC 13.0*  HGB 13.4  HCT 40.7  MCV 89.5  PLT 277   Basic Metabolic Panel  Recent Labs  06/04/15 0518  NA 140  K 4.2  CL 109  CO2 23  GLUCOSE 104*  BUN 10  CREATININE 1.07  CALCIUM 8.1*    Disposition  Pt is being discharged home today in good condition.  Follow-up Plans & Appointments  Follow-up Information    Follow up with Murray Hodgkins, NP On 06/18/2015.   Specialties:  Nurse Practitioner, Cardiology, Radiology   Why:  11:30   Contact information:   [address] [address] [phone]       Discharge Instructions    Diet - low sodium heart healthy    Complete by:  As directed      Increase activity slowly    Complete by:  As directed   NO HEAVY LIFTING (>10lbs) X 10 DAYS. NO SEXUAL ACTIVITY X 2 WEEKS. NO DRIVING X 1 WEEK. NO SOAKING BATHS, HOT TUBS, POOLS, ETC., X 7 DAYS.           Discharge Medications    Medication List    STOP taking these medications        amLODipine 5 MG tablet  Commonly known as:  NORVASC     lisinopril 20 MG tablet  Commonly known as:  PRINIVIL,ZESTRIL     ranolazine 500 MG 12 hr tablet  Commonly known as:  RANEXA        TAKE these medications        acetaminophen 500 MG tablet  Commonly known as:  TYLENOL  Take 500 mg by mouth every 6 (six) hours as needed (pain).     ALPRAZolam 0.25 MG tablet  Commonly known as:  XANAX  Take 1 tablet (0.25 mg total) by mouth daily as needed for anxiety.     aspirin EC 81 MG tablet  Take 81 mg by mouth daily after supper.     atorvastatin 80 MG tablet  Commonly known as:  LIPITOR  TAKE 1 TABLET BY MOUTH DAILY AT 6 PM     clopidogrel 75 MG tablet  Commonly known as:  PLAVIX  TAKE 1 TABLET BY MOUTH DAILY WITH BREAKFAST     nitroGLYCERIN 0.4 MG SL tablet  Commonly known as:  NITROSTAT  Place 1 tablet (0.4 mg  total) under the tongue every 5 (five) minutes x 3 doses as needed for chest pain.     oxyCODONE-acetaminophen 5-325 MG per tablet  Commonly known as:  PERCOCET/ROXICET  Take 1 tablet by mouth every 4 (four) hours as needed for moderate pain or severe pain.     valsartan 160 MG tablet  Commonly known as:  DIOVAN  Take 1 tablet (160 mg total) by mouth daily.        Duration of Discharge Encounter   Greater than 30 minutes including physician time.  Signed, Bhagat,Bhavinkumar PA-C 06/04/2015, 8:28 AM    I have examined the patient and reviewed assessment and plan and discussed with patient. Agree with above as stated. Doing well post PCI. No hematoma bilaterally.  2+ PT pulses bilaterally.  Crushed stent to bifurcation of circ and OM1. Continue longterm plavix therapy, > 1 year. Aggressive secondary prevention. Recommended rehab. Explained to patient why statin therapy was needed. Holding amlodipine at this time. BP is ok off of meds. Will restart ARB. If needed, amlodipine can be added back. Cardiology f/u with Dr. Gwenlyn Found.   VARANASI,JAYADEEP S.

## 2015-06-18 ENCOUNTER — Encounter: Payer: Self-pay | Admitting: Nurse Practitioner

## 2015-06-18 ENCOUNTER — Ambulatory Visit (INDEPENDENT_AMBULATORY_CARE_PROVIDER_SITE_OTHER): Payer: BLUE CROSS/BLUE SHIELD | Admitting: Nurse Practitioner

## 2015-06-18 VITALS — BP 120/82 | HR 57 | Ht 69.0 in | Wt 201.2 lb

## 2015-06-18 DIAGNOSIS — I10 Essential (primary) hypertension: Secondary | ICD-10-CM

## 2015-06-18 DIAGNOSIS — E785 Hyperlipidemia, unspecified: Secondary | ICD-10-CM | POA: Diagnosis not present

## 2015-06-18 DIAGNOSIS — I2510 Atherosclerotic heart disease of native coronary artery without angina pectoris: Secondary | ICD-10-CM

## 2015-06-18 NOTE — Patient Instructions (Signed)
Medication Instructions:   Your physician recommends that you continue on your current medications as directed. Please refer to the Current Medication list given to you today.   Labwork:   Testing/Procedures:   Follow-Up:  WITH DR Allyson SabalBERRY IN 2 MONTHS   Any Other Special Instructions Will Be Listed Below (If Applicable).

## 2015-06-18 NOTE — Progress Notes (Signed)
Patient Name: Tyler Holder Date of Encounter: 06/18/2015  Primary Care Provider:  Leanor Rubenstein, MD Primary Cardiologist:  Erlene Quan, MD   Chief Complaint  54 year old male status post recent PCI for a chronic total occlusion of the left circumflex, who presents for follow-up.  Past Medical History   Past Medical History  Diagnosis Date  . Essential hypertension   . Family history of heart disease   . Hyperlipidemia LDL goal <70   . CAD (coronary artery disease)     a. 05/2014 RCA 90p (3.5x18 Resolute Integrity DES), LCX CTO, LAD 92m;  b. 03/2015 abnl MV;  c. 03/2015 Cath: patent RCA, CTO LCX;  c. 6/16 PCI CTO LCX (3.0x28 Synergy DES), CTO OM1 (2.5x16 Synergy DES).  . Sinus bradycardia    Past Surgical History  Procedure Laterality Date  . Left heart catheterization with coronary angiogram N/A 06/06/2014    Procedure: LEFT HEART CATHETERIZATION WITH CORONARY ANGIOGRAM;  Surgeon: Corky Crafts, MD;  Location: [facility] CATH LAB;  Service: Cardiovascular;  Laterality: N/A;  . Percutaneous coronary stent intervention (pci-s)  06/06/2014    Procedure: PERCUTANEOUS CORONARY STENT INTERVENTION (PCI-S);  Surgeon: Corky Crafts, MD;  Location: [facility] CATH LAB;  Service: Cardiovascular;;  . Left heart catheterization with coronary angiogram N/A 03/30/2015    Procedure: LEFT HEART CATHETERIZATION WITH CORONARY ANGIOGRAM;  Surgeon: Runell Gess, MD;  Location: [facility] C[facility] LAB;  Service: Cardiovascular;  Laterality: N/A;  . Coronary angioplasty with stent placement  06/06/2014    DES to Prox RCA  . Cardiac catheterization  03/30/2015  . Cardiac catheterization N/A 06/03/2015    Procedure: Coronary/Bypass Graft CTO Intervention;  Surgeon: Corky Crafts, MD;  Location[facility] INVASIVE CV LAB;  Service: Cardiovascular;  Laterality: N/A;  . Cardiac catheterization  06/03/2015    Procedure: Left Heart Cath;  Surgeon: Corky Crafts, MD;  Location: [facility] INVASIVE CV LAB;  Service: Cardiovascular;;     Allergies  Allergies  Allergen Reactions  . Imdur [Isosorbide] Other (See Comments)    Headache  . Lisinopril Cough    HPI  54 year old male with the above complex problem list. He is status post PCI and gurgling stent placement to the right coronary artery in June 2015. 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 Earlier this year, he developed chest discomfort and underwent stress testing which was abnormal. This led to catheterization in April which showed stable RCA stent with ongoing total occlusion of the left circumflex. He was subsequently referred for CTO PCI with Dr. Eldridge Dace. This was performed June 15 with placement of drug-eluting stents in the left circumflex and also first obtuse marginal. Patient tolerated the procedure well and was discharged the following day. Since discharge, he has done exceptionally well. He has been walking daily without chest pain or dyspnea. He has had some bruising at bilateral groin sites but this has not been limiting and bruising has been resolving. He denies PND, orthopnea, dizziness, syncope, edema, or early satiety.  Home Medications  Prior to Admission medications   Medication Sig Start Date End Date Taking? Authorizing Provider  acetaminophen (TYLENOL) 500 MG tablet Take 500 mg by mouth every 6 (six) hours as needed (pain).   Yes Historical Provider, MD  aspirin EC 81 MG tablet Take 81 mg by mouth daily after supper.   Yes Historical Provider, MD  atorvastatin (LIPITOR) 80 MG tablet Take 80 mg  by mouth daily after supper.   Yes Historical Provider, MD  clopidogrel (PLAVIX) 75 MG tablet TAKE 1 TABLET BY MOUTH DAILY WITH BREAKFAST 05/28/15  Yes Leone BrandLaura R Ingold, NP  nitroGLYCERIN (NITROSTAT) 0.4 MG SL tablet Place 1 tablet (0.4 mg total) under the tongue every 5 (five) minutes x 3 doses as needed for chest pain. 06/07/14  Yes Leone BrandLaura R Ingold, NP    Review of  Systems  As above, doing well w/o chest pain or dyspnea.  He denies palpitations, pnd, orthopnea, n, v, dizziness, syncope, edema, weight gain, or early satiety. .  All other systems reviewed and are otherwise negative except as noted above.  Physical Exam  VS:  BP 120/82 mmHg  Pulse 57  Ht 5\' 9"  (1.753 m)  Wt 201 lb 3.2 oz (91.264 kg)  BMI 29.70 kg/m2 , BMI Body mass index is 29.7 kg/(m^2). GEN: Well nourished, well developed, in no acute distress. HEENT: normal. Neck: Supple, no JVD, carotid bruits, or masses. Cardiac: RRR, no murmurs, rubs, or gallops. No clubbing, cyanosis, edema.  Radials/DP/PT 2+ and equal bilaterally. Bilateral groin sites are notable for mild ecchymosis without bleeding, bruit, or hematoma. Respiratory:  Respirations regular and unlabored, clear to auscultation bilaterally. GI: Soft, nontender, nondistended, BS + x 4. MS: no deformity or atrophy. Skin: warm and dry, no rash. Neuro:  Strength and sensation are intact. Psych: Normal affect.  Accessory Clinical Findings  ECG - sinus bradycardia, 57, no acute ST or T changes. Assessment & Plan  1.  Coronary artery disease: Status post recent PCI and drug-eluting stent placement to the chronically totally occluded left circumflex and first obtuse marginal. Patient has done well postprocedure without recurrence of chest pain or dyspnea. He has been walking regularly without limitations and is eager to get back to work. He remains on aspirin, statin, Plavix. He is not on beta blocker in the setting of chronic sinus bradycardia.  2. Essential hypertension: Stable on ARB therapy.  3. Hyperlipidemia: Lipids in March revealed an LDL of 35 with normal LFTs. He remains on high potency statin therapy.  4. Disposition: Follow-up with Dr. Allyson SabalBerry in 2-3 months or sooner if needed.    Nicolasa Duckinghristopher Alieyah Spader, NP 06/18/2015, 12:52 PM

## 2015-06-25 ENCOUNTER — Telehealth: Payer: Self-pay | Admitting: *Deleted

## 2015-06-25 NOTE — Telephone Encounter (Signed)
Return to work papers received via fax from [employer].  I will defer to Dr Allyson SabalBerry.

## 2015-06-29 NOTE — Telephone Encounter (Signed)
I spoke with patient and confirmed that his return to work date was 06/15/15 without restrictions.  Dr Allyson SabalBerry signed form and form was faxed back to [employer].

## 2015-06-29 NOTE — Telephone Encounter (Signed)
lmom 

## 2015-06-29 NOTE — Telephone Encounter (Signed)
lmom for patient to contact me about some questions that I have regarding the papers.

## 2015-06-29 NOTE — Telephone Encounter (Signed)
Returning your call. °

## 2015-07-23 ENCOUNTER — Other Ambulatory Visit: Payer: Self-pay | Admitting: Cardiovascular Disease

## 2015-07-23 NOTE — Telephone Encounter (Signed)
REFILL 

## 2015-08-03 ENCOUNTER — Encounter (HOSPITAL_COMMUNITY): Payer: Self-pay | Admitting: Interventional Cardiology

## 2015-08-18 ENCOUNTER — Ambulatory Visit: Payer: BLUE CROSS/BLUE SHIELD | Admitting: Cardiovascular Disease

## 2015-08-25 ENCOUNTER — Other Ambulatory Visit: Payer: Self-pay | Admitting: Cardiovascular Disease

## 2015-09-16 ENCOUNTER — Other Ambulatory Visit: Payer: Self-pay | Admitting: Cardiology

## 2015-09-16 ENCOUNTER — Other Ambulatory Visit: Payer: Self-pay | Admitting: Cardiovascular Disease

## 2015-09-16 NOTE — Telephone Encounter (Signed)
Rx(s) sent to pharmacy electronically.  

## 2015-09-16 NOTE — Telephone Encounter (Signed)
REFILL 

## 2015-09-23 ENCOUNTER — Encounter: Payer: Self-pay | Admitting: Cardiovascular Disease

## 2015-09-23 ENCOUNTER — Ambulatory Visit (INDEPENDENT_AMBULATORY_CARE_PROVIDER_SITE_OTHER): Payer: BLUE CROSS/BLUE SHIELD | Admitting: Cardiovascular Disease

## 2015-09-23 VITALS — BP 118/68 | HR 73 | Ht 69.0 in | Wt 197.0 lb

## 2015-09-23 DIAGNOSIS — I2510 Atherosclerotic heart disease of native coronary artery without angina pectoris: Secondary | ICD-10-CM

## 2015-09-23 DIAGNOSIS — E785 Hyperlipidemia, unspecified: Secondary | ICD-10-CM | POA: Diagnosis not present

## 2015-09-23 DIAGNOSIS — I10 Essential (primary) hypertension: Secondary | ICD-10-CM | POA: Diagnosis not present

## 2015-09-23 NOTE — Patient Instructions (Signed)
Medication Instructions:  Your physician recommends that you continue on your current medications as directed. Please refer to the Current Medication list given to you today.   Labwork: none  Testing/Procedures: none  Follow-Up: Your physician wants you to follow-up in: 6 months with Dr. Allyson Sabal. You will receive a reminder letter in the mail two months in advance. If you don't receive a letter, please call our office to schedule the follow-up appointment.   Any Other Special Instructions Will Be Listed Below (If Applicable).

## 2015-09-23 NOTE — Progress Notes (Signed)
09/23/2015 Tyler Holder   01-Feb-1961  [id number]  Primary Physician Leanor Rubenstein, MD Primary Cardiologist: Runell Gess MD Roseanne Reno   HPI:  Tyler Holder is a 54 year old fit appearing married Caucasian male father of 2 children, grandfather and one grandchild who does HVAC for a living. He was referred by his primary care physician for evaluation of chest pain. I last saw him in the office 03/24/15 and since that time he's had intervention by Dr. Eldridge Dace on the dominant RCA using a drug-eluting stent (3.5 mm x 18 mm). 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 His cardiac risk factors include hypertension and family history (father died of myocardial infarction in his early 21s). Because of recurrent symptoms and a Myoview that showed inferolateral ischemia I performed chronic catheterization on him 03/30/15 revealing unchanged anatomy. I thought it was possible that his circumflex chronic total occlusion was continuing to his chest pain as well as his Myoview abnormality and therefore referred him back to Dr. Eldridge Dace who performed a complex CTO intervention 06/03/15 using both antegrade and retrograde approach. He performed circumflex AV groove and obtuse marginal branch bifurcation stenting. His symptoms have since resolved. He works out at [employer] without limitation. His most recent lipid profile performed 03/18/15 revealed total cholesterol 85, LDL 35 and HDL of 27.  Current Outpatient Prescriptions  Medication Sig Dispense Refill  . acetaminophen (TYLENOL) 500 MG tablet Take 500 mg by mouth every 6 (six) hours as needed (pain).    [name] amLODipine (NORVASC) 5 MG tablet TAKE 1 TABLET BY MOUTH EVERY DAY 180 tablet 0  . aspirin EC 81 MG tablet Take 81 mg by mouth daily after supper.    [name] atorvastatin (LIPITOR) 80 MG tablet TAKE 1 TABLET BY MOUTH DAILY AT 6 PM 30 tablet 0  .  clopidogrel (PLAVIX) 75 MG tablet TAKE 1 TABLET BY MOUTH DAILY WITH BREAKFAST 30 tablet 5  . nitroGLYCERIN (NITROSTAT) 0.4 MG SL tablet Place 1 tablet (0.4 mg total) under the tongue every 5 (five) minutes x 3 doses as needed for chest pain. 25 tablet 4  . valsartan (DIOVAN) 160 MG tablet TAKE 1 TABLET BY MOUTH DAILY 30 tablet 8   No current facility-administered medications for this visit.    Allergies  Allergen Reactions  . Imdur [Isosorbide] Other (See Comments)    Headache  . Lisinopril Cough    Social History   Social History  . Marital Status: Married    Spouse Name: N/A  . Number of Children: N/A  . Years of Education: N/A   Occupational History  . Not on file.   Social History Main Topics  . Smoking status: Never Smoker   . Smokeless tobacco: Never Used  . Alcohol Use: No  . Drug Use: No  . Sexual Activity: Yes   Other Topics Concern  . Not on file   Social History Narrative     Review of Systems: General: negative for chills, fever, night sweats or weight changes.  Cardiovascular: negative for chest pain, dyspnea on exertion, edema, orthopnea, palpitations, paroxysmal nocturnal dyspnea or shortness of breath Dermatological: negative for rash Respiratory: negative for cough or wheezing Urologic: negative for hematuria Abdominal: negative for nausea, vomiting, diarrhea, bright red blood per rectum, melena, or hematemesis Neurologic: negative for visual changes, syncope, or dizziness All other systems reviewed and are otherwise negative except  as noted above.    Blood pressure 118/68, pulse 73, height  (1.753 m), weight 197 lb (89.359 kg).  General appearance: alert and no distress Neck: no adenopathy, no carotid bruit, no JVD, supple, symmetrical, trachea midline and thyroid not enlarged, symmetric, no tenderness/mass/nodules Lungs: clear to auscultation bilaterally Heart: regular rate and rhythm, S1, S2 normal, no murmur, click, rub or  gallop Extremities: extremities normal, atraumatic, no cyanosis or edema  EKG not performed today  ASSESSMENT AND PLAN:   Hyperlipidemia LDL goal <70 History of hyperlipidemia on atorvastatin 80 mg a day with recent lipid profile performed 03/18/15 revealing total cholesterol of 85, LDL 35 and HDL of 27  Essential hypertension History of hypertension blood pressure measured today at 118/68. He is on amlodipine and valsartan. Continue current meds at current dosing  CAD, multiple vessel, culprit RCA -prox with DES placed, total LCX-old, mod LAD- FFR not Hemodynamically signficant 06/06/14  History of CAD status post RCA stenting by Dr. Eldridge Dace 06/06/14 with a drug-eluting stent (3.5 mm x 18 mm). At that time he had and it occluded nondominant circumflex with right-to-left collaterals and a 50% segmental proximal LAD lesion which was proven to be  Not physiologically significant by FFR. Because of recurrent symptoms and ischemia inferolaterally by Myoview stress test I performed cardiac catheterization on him 03/30/15 revealing basically unchanged anatomy. I was concerned that his ischemia was related to his circumflex chronic total occlusion and referred him back to Dr. Eldridge Dace for a CTO procedure which was performed successfully 06/03/15 and retrograde with bifurcation stents. Since that time. He has been angina free on dual antiplatelet therapy.      Runell Gess MD FACP,FACC,FAHA, [facility] 09/23/2015 4:13 PM

## 2015-09-23 NOTE — Assessment & Plan Note (Signed)
History of hypertension blood pressure measured today at 118/68. He is on amlodipine and valsartan. Continue current meds at current dosing

## 2015-09-23 NOTE — Assessment & Plan Note (Signed)
History of CAD status post RCA stenting by Dr. Eldridge Dace 06/06/14 with a drug-eluting stent (3.5 mm x 18 mm). At that time he had and it occluded nondominant circumflex with right-to-left collaterals and a 50% segmental proximal LAD lesion which was proven to be  Not physiologically significant by FFR. Because of recurrent symptoms and ischemia inferolaterally by Myoview stress test I performed cardiac catheterization on him 03/30/15 revealing basically unchanged anatomy. I was concerned that his ischemia was related to his circumflex chronic total occlusion and referred him back to Dr. Eldridge Dace for a CTO procedure which was performed successfully 06/03/15 and retrograde with bifurcation stents. Since that time. He has been angina free on dual antiplatelet therapy.

## 2015-09-23 NOTE — Assessment & Plan Note (Signed)
History of hyperlipidemia on atorvastatin 80 mg a day with recent lipid profile performed 03/18/15 revealing total cholesterol of 85, LDL 35 and HDL of 27

## 2015-10-14 ENCOUNTER — Other Ambulatory Visit: Payer: Self-pay | Admitting: Cardiovascular Disease

## 2015-11-17 ENCOUNTER — Other Ambulatory Visit: Payer: Self-pay | Admitting: Cardiology

## 2015-11-17 NOTE — Telephone Encounter (Signed)
REFILL 

## 2016-02-21 ENCOUNTER — Other Ambulatory Visit: Payer: Self-pay | Admitting: Cardiovascular Disease

## 2016-02-22 NOTE — Telephone Encounter (Signed)
Rx(s) sent to pharmacy electronically.  

## 2016-02-23 IMAGING — CR DG CHEST 2V
2 series · 2 of 2 positions shown · non-contrast
Comparison: None.

CLINICAL DATA: Chest pain with difficulty breathing

EXAM:
CHEST  2 VIEW

[w chest pa]
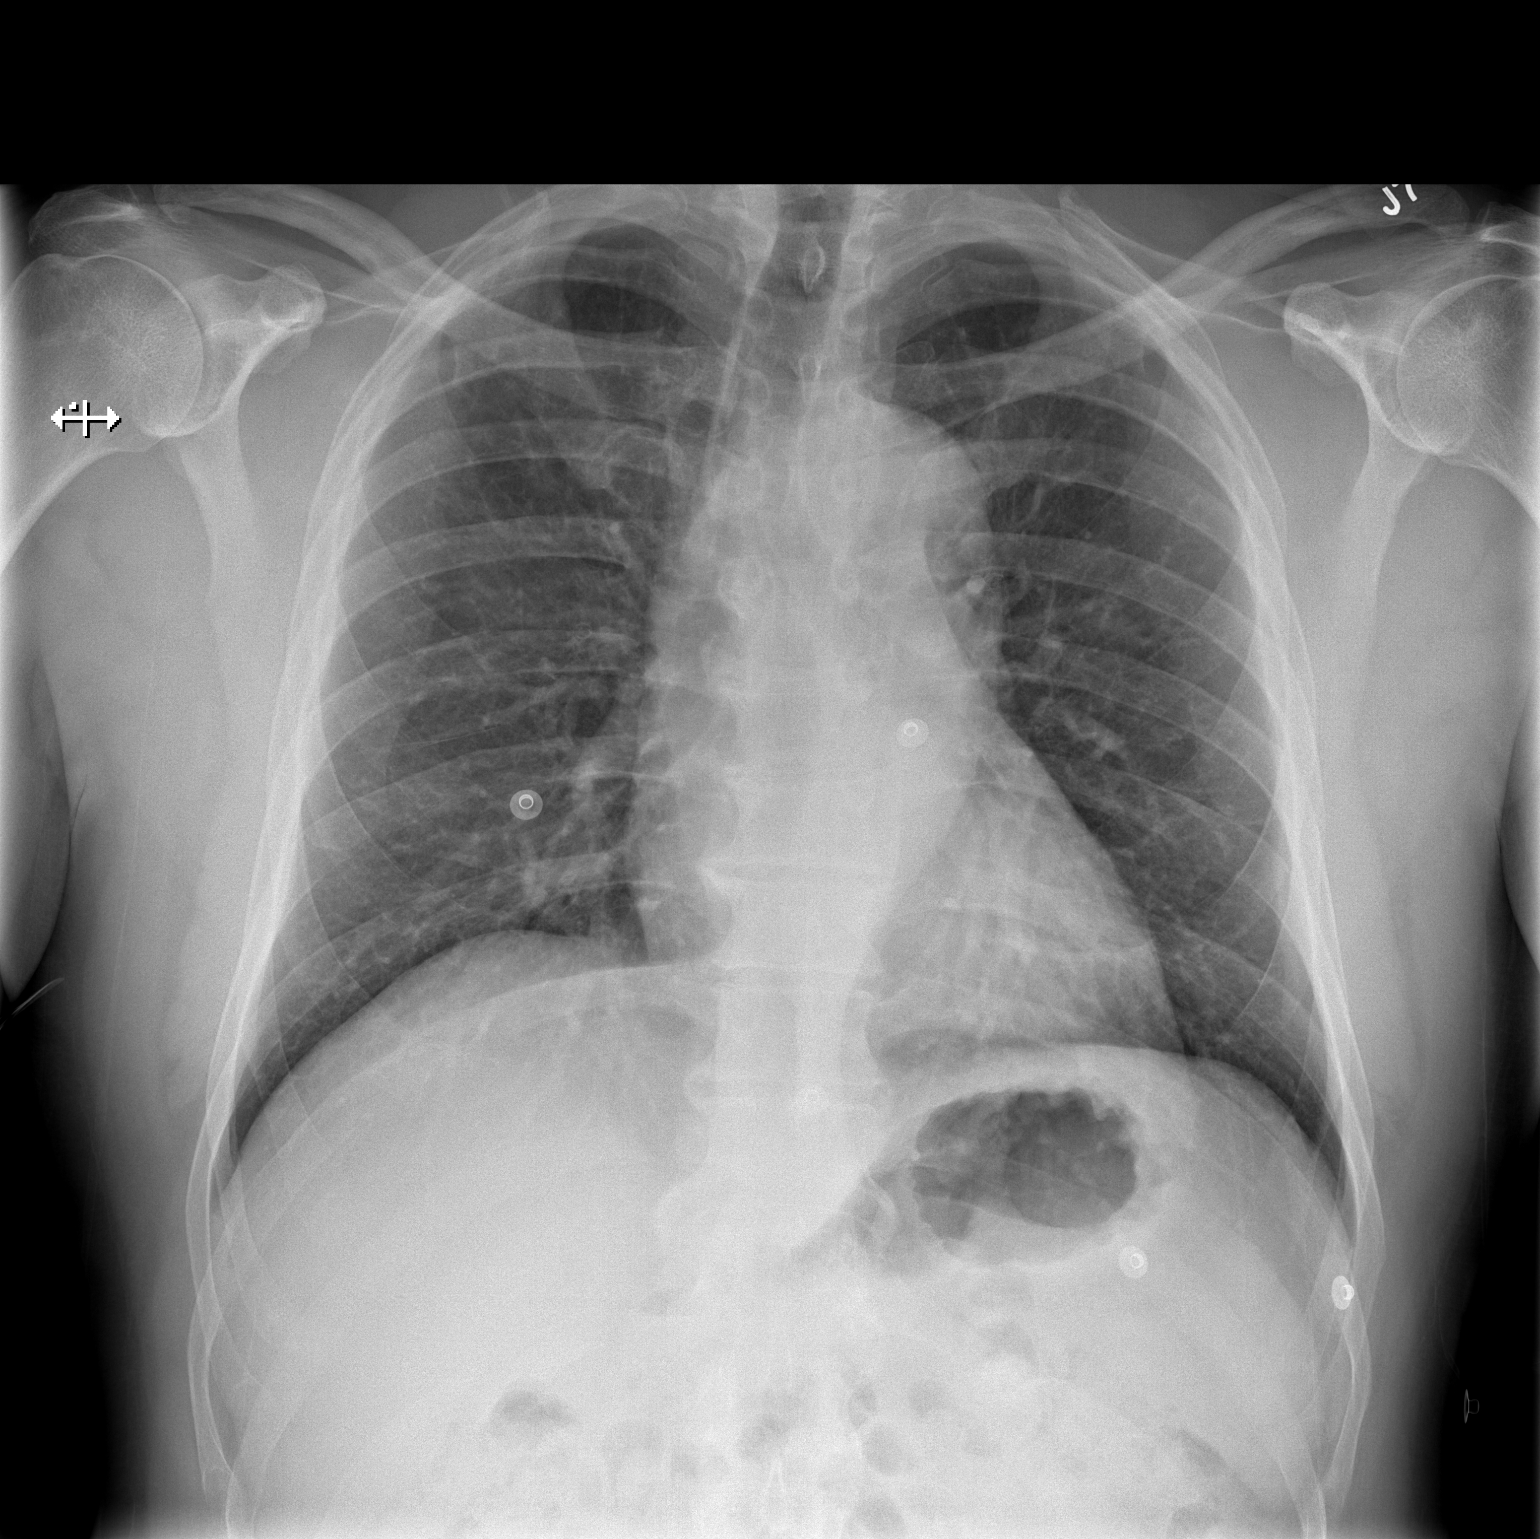

[w chest lat]
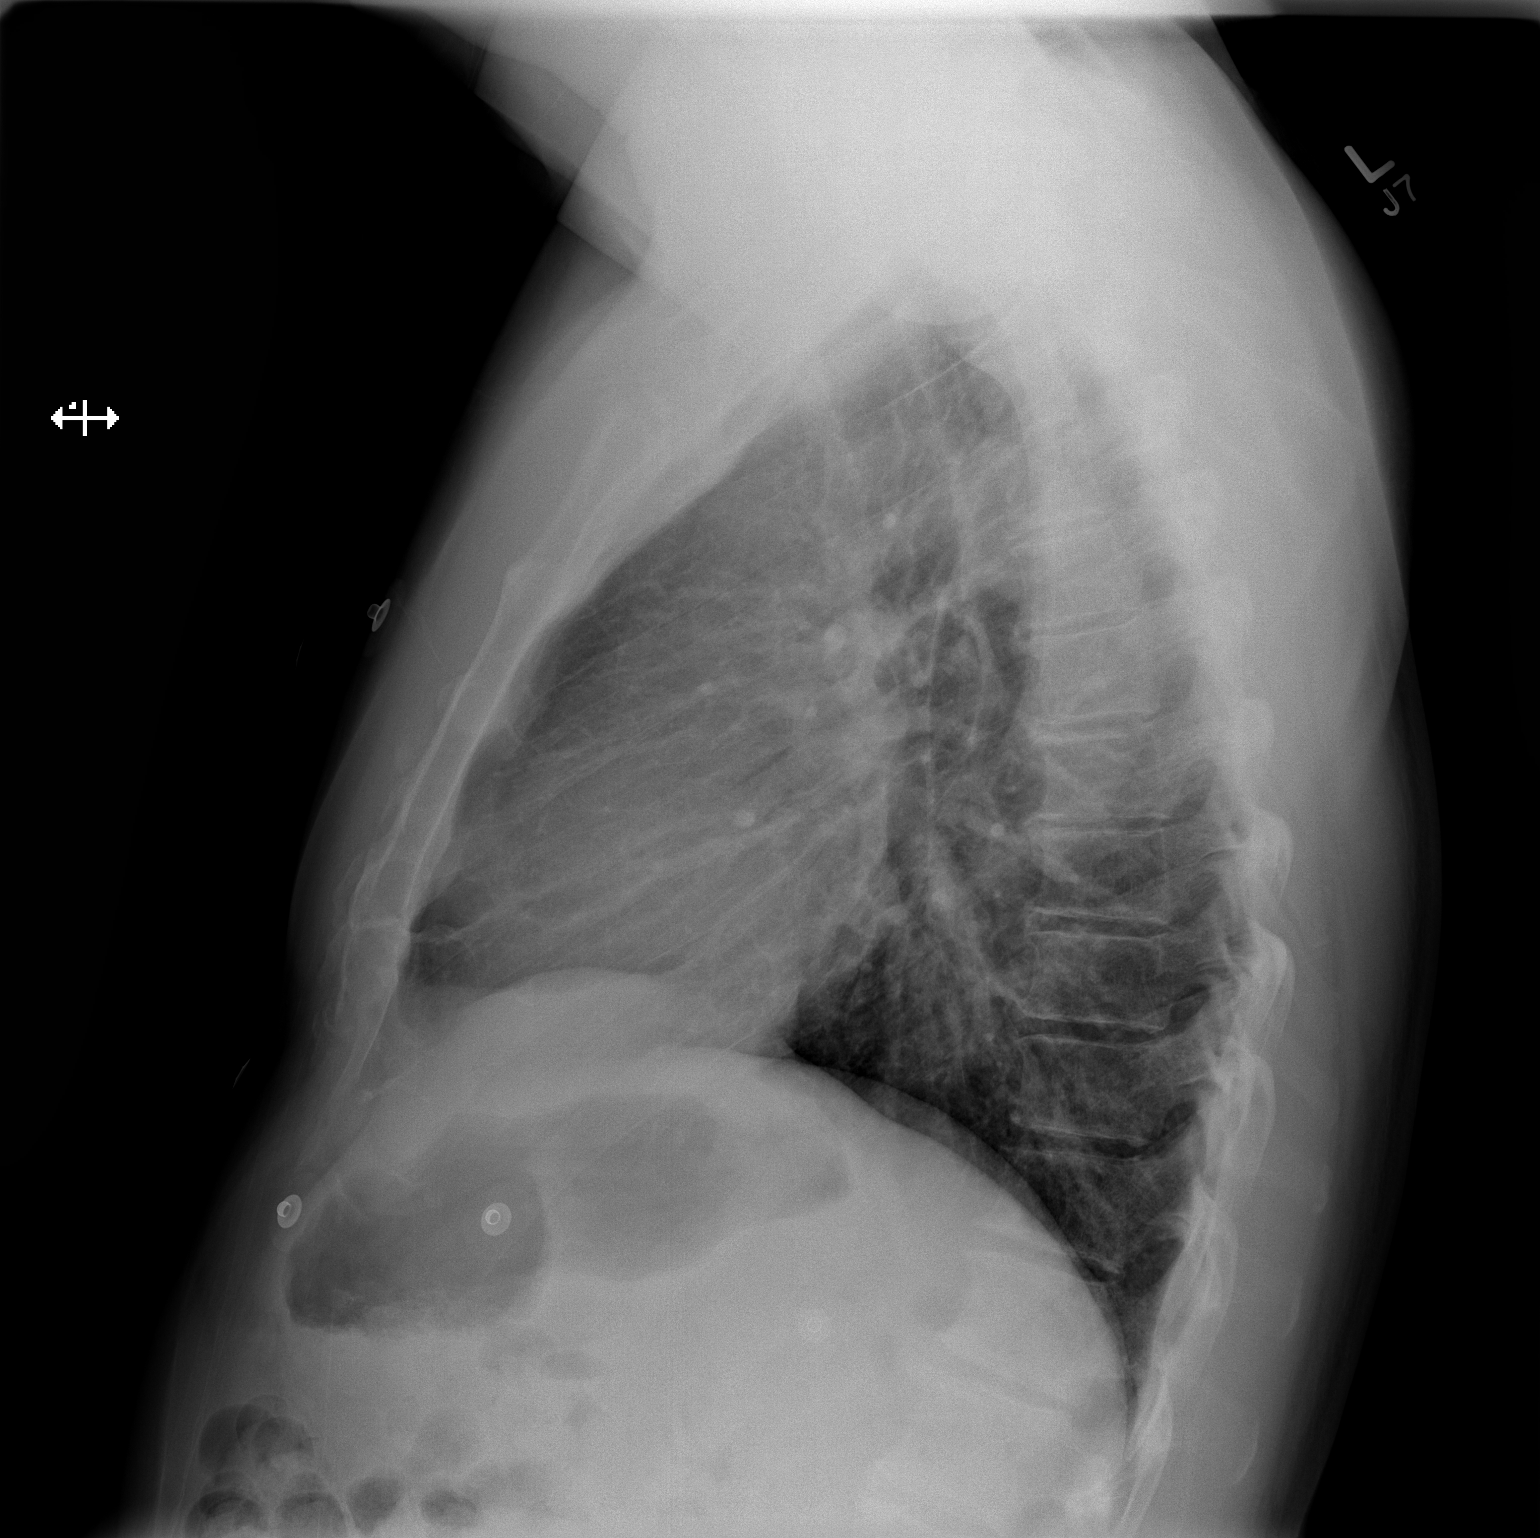

[2 of 2 positions shown; findings below may reference images not displayed]

FINDINGS: Lungs are clear. Heart size and pulmonary vascularity are normal. No
adenopathy. No bone lesions. Aorta is rather prominent.
IMPRESSION: Aorta is rather prominent. This finding may indicate chronic
hypertensive change. Dissection of the aorta potentially could
present in this manner. There is no edema or consolidation.

## 2016-04-26 DIAGNOSIS — H40021 Open angle with borderline findings, high risk, right eye: Secondary | ICD-10-CM | POA: Diagnosis not present

## 2016-05-15 ENCOUNTER — Other Ambulatory Visit: Payer: Self-pay | Admitting: Cardiology

## 2016-06-14 DIAGNOSIS — H40053 Ocular hypertension, bilateral: Secondary | ICD-10-CM | POA: Diagnosis not present

## 2016-06-14 DIAGNOSIS — H40021 Open angle with borderline findings, high risk, right eye: Secondary | ICD-10-CM | POA: Diagnosis not present

## 2016-06-15 ENCOUNTER — Other Ambulatory Visit: Payer: Self-pay | Admitting: Cardiovascular Disease

## 2016-06-15 NOTE — Telephone Encounter (Signed)
Rx(s) sent to pharmacy electronically.  

## 2016-07-14 ENCOUNTER — Other Ambulatory Visit: Payer: Self-pay | Admitting: Cardiovascular Disease

## 2016-07-14 NOTE — Telephone Encounter (Signed)
REFILL 

## 2016-08-11 ENCOUNTER — Other Ambulatory Visit: Payer: Self-pay | Admitting: Cardiovascular Disease

## 2016-08-15 ENCOUNTER — Other Ambulatory Visit: Payer: Self-pay | Admitting: Cardiovascular Disease

## 2016-08-15 NOTE — Telephone Encounter (Signed)
Rx request sent to pharmacy.  

## 2016-08-26 ENCOUNTER — Other Ambulatory Visit: Payer: Self-pay | Admitting: Cardiology

## 2016-08-26 NOTE — Telephone Encounter (Signed)
Rx(s) sent to pharmacy electronically.  

## 2016-09-12 ENCOUNTER — Other Ambulatory Visit: Payer: Self-pay | Admitting: Cardiovascular Disease

## 2016-09-12 NOTE — Telephone Encounter (Signed)
Rx request sent to pharmacy.  

## 2016-10-12 ENCOUNTER — Other Ambulatory Visit: Payer: Self-pay | Admitting: Cardiovascular Disease

## 2016-10-25 DIAGNOSIS — H40053 Ocular hypertension, bilateral: Secondary | ICD-10-CM | POA: Diagnosis not present

## 2016-11-07 ENCOUNTER — Other Ambulatory Visit: Payer: Self-pay | Admitting: Cardiovascular Disease

## 2016-11-15 DIAGNOSIS — H40053 Ocular hypertension, bilateral: Secondary | ICD-10-CM | POA: Diagnosis not present

## 2016-11-17 ENCOUNTER — Other Ambulatory Visit: Payer: Self-pay | Admitting: Cardiovascular Disease

## 2016-11-21 ENCOUNTER — Other Ambulatory Visit: Payer: Self-pay | Admitting: Cardiovascular Disease

## 2016-12-01 DIAGNOSIS — R05 Cough: Secondary | ICD-10-CM | POA: Diagnosis not present

## 2016-12-01 DIAGNOSIS — J019 Acute sinusitis, unspecified: Secondary | ICD-10-CM | POA: Diagnosis not present

## 2016-12-05 ENCOUNTER — Other Ambulatory Visit: Payer: Self-pay | Admitting: Cardiovascular Disease

## 2016-12-06 ENCOUNTER — Other Ambulatory Visit: Payer: Self-pay | Admitting: *Deleted

## 2016-12-06 MED ORDER — ATORVASTATIN CALCIUM 80 MG PO TABS: ORAL_TABLET | ORAL | 0 refills | Status: DC

## 2016-12-13 ENCOUNTER — Other Ambulatory Visit: Payer: Self-pay | Admitting: Cardiovascular Disease

## 2016-12-14 NOTE — Telephone Encounter (Signed)
Due for follow up appt-lmtcb

## 2016-12-30 ENCOUNTER — Other Ambulatory Visit: Payer: Self-pay | Admitting: Cardiovascular Disease

## 2017-01-02 ENCOUNTER — Other Ambulatory Visit: Payer: Self-pay | Admitting: Cardiovascular Disease

## 2017-01-02 MED ORDER — ATORVASTATIN CALCIUM 80 MG PO TABS: ORAL_TABLET | ORAL | 0 refills | Status: DC

## 2017-01-02 NOTE — Telephone Encounter (Signed)
Rx(s) sent to pharmacy electronically. Patient has MD OV Jan 25, 2017

## 2017-01-16 DIAGNOSIS — H40053 Ocular hypertension, bilateral: Secondary | ICD-10-CM | POA: Diagnosis not present

## 2017-01-25 ENCOUNTER — Encounter: Payer: Self-pay | Admitting: Cardiovascular Disease

## 2017-01-25 ENCOUNTER — Ambulatory Visit (INDEPENDENT_AMBULATORY_CARE_PROVIDER_SITE_OTHER): Payer: BLUE CROSS/BLUE SHIELD | Admitting: Cardiovascular Disease

## 2017-01-25 VITALS — BP 112/62 | HR 63 | Ht 69.5 in | Wt 197.4 lb

## 2017-01-25 DIAGNOSIS — I2510 Atherosclerotic heart disease of native coronary artery without angina pectoris: Secondary | ICD-10-CM | POA: Diagnosis not present

## 2017-01-25 DIAGNOSIS — I10 Essential (primary) hypertension: Secondary | ICD-10-CM | POA: Diagnosis not present

## 2017-01-25 DIAGNOSIS — E785 Hyperlipidemia, unspecified: Secondary | ICD-10-CM | POA: Diagnosis not present

## 2017-01-25 NOTE — Assessment & Plan Note (Signed)
History of hypertension blood pressure measures 112/62. He is on amlodipine and valsartan. Continue current meds at current dosing

## 2017-01-25 NOTE — Patient Instructions (Signed)

## 2017-01-25 NOTE — Progress Notes (Signed)
01/25/2017 Tyler Holder   February 04, 1961  [id number]  Primary Physician Leanor Rubenstein, MD Primary Cardiologist: Runell Gess MD Roseanne Reno  HPI:  Tyler Holder is a 56 year old fit appearing married Caucasian male father of 2 children, grandfather and one grandchild who does HVAC for a living. He was referred by his primary care physician for evaluation of chest pain. I last saw him in the office 09/23/15 and since that time he's had intervention by Dr. Eldridge Dace on the dominant RCA using a drug-eluting stent (3.5 mm x 18 mm). 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 His cardiac risk factors include hypertension and family history (father died of myocardial infarction in his early 74s). Because of recurrent symptoms and a Myoview that showed inferolateral ischemia I performed chronic catheterization on him 03/30/15 revealing unchanged anatomy. I thought it was possible that his circumflex chronic total occlusion was continuing to his chest pain as well as his Myoview abnormality and therefore referred him back to Dr. Eldridge Dace who performed a complex CTO intervention 06/03/15 using both antegrade and retrograde approach. He performed circumflex AV groove and obtuse marginal branch bifurcation stenting. His symptoms have since resolved. He works out at [employer] without limitation.   Current Outpatient Prescriptions  Medication Sig Dispense Refill  . acetaminophen (TYLENOL) 500 MG tablet Take 500 mg by mouth every 6 (six) hours as needed (pain).    [name] amLODipine (NORVASC) 5 MG tablet Take 1 tablet (5 mg total) by mouth daily. Please schedule appointment for refills. 180 tablet 0  . aspirin EC 81 MG tablet Take 81 mg by mouth daily after supper.    [name] atorvastatin (LIPITOR) 80 MG tablet Take 1 tablet by mouth daily. <MUST KEEP APPOINTMENT FEB. 7> 30 tablet 0  . clopidogrel (PLAVIX) 75 MG  tablet TAKE 1 TABLET BY MOUTH DAILY WITH BREAKFAST 90 tablet 0  . COMBIGAN 0.2-0.5 % ophthalmic solution Place 1 drop into both eyes as directed.  6  . nitroGLYCERIN (NITROSTAT) 0.4 MG SL tablet Place 1 tablet (0.4 mg total) under the tongue every 5 (five) minutes x 3 doses as needed for chest pain. 25 tablet 4  . TRAVATAN Z 0.004 % SOLN ophthalmic solution Place 1 drop into both eyes as directed.  5  . valsartan (DIOVAN) 160 MG tablet TAKE 1 TABLET BY MOUTH EVERY DAY 30 tablet 0   No current facility-administered medications for this visit.     Allergies  Allergen Reactions  . Imdur [Isosorbide Nitrate] Other (See Comments)    Headache  . Lisinopril Cough    Social History   Social History  . Marital status: Married    Spouse name: N/A  . Number of children: N/A  . Years of education: N/A   Occupational History  . Not on file.   Social History Main Topics  . Smoking status: Never Smoker  . Smokeless tobacco: Never Used  . Alcohol use No  . Drug use: No  . Sexual activity: Yes   Other Topics Concern  . Not on file   Social History Narrative  . No narrative on file     Review of Systems: General: negative for chills, fever, night sweats or weight changes.  Cardiovascular: negative for chest pain, dyspnea on exertion, edema, orthopnea, palpitations, paroxysmal nocturnal dyspnea or shortness of breath Dermatological: negative for rash Respiratory: negative for  cough or wheezing Urologic: negative for hematuria Abdominal: negative for nausea, vomiting, diarrhea, bright red blood per rectum, melena, or hematemesis Neurologic: negative for visual changes, syncope, or dizziness All other systems reviewed and are otherwise negative except as noted above.    Blood pressure 112/62, pulse 63, height 5' 9.5" (1.765 m), weight 197 lb 6.4 oz (89.5 kg).  General appearance: alert and no distress Neck: no adenopathy, no carotid bruit, no JVD, supple, symmetrical, trachea  midline and thyroid not enlarged, symmetric, no tenderness/mass/nodules Lungs: clear to auscultation bilaterally Heart: regular rate and rhythm, S1, S2 normal, no murmur, click, rub or gallop Extremities: extremities normal, atraumatic, no cyanosis or edema  EKG sinus rhythm at 63 without ST or T-wave changes. I personally reviewed this EKG  ASSESSMENT AND PLAN:   Hyperlipidemia LDL goal <70 History of hyperlipidemia on statin therapy. We'll recheck a lipid and liver profile  Essential hypertension History of hypertension blood pressure measures 112/62. He is on amlodipine and valsartan. Continue current meds at current dosing  CAD, multiple vessel, culprit RCA -prox with DES placed, total LCX-old, mod LAD- FFR not Hemodynamically signficant 06/06/14  History of CAD status post intervention by Dr. Eldridge DaceVaranasi a right dominant RCA using a drug-eluting stent (3.5 mm x 18 mm). The time of his original cath was also found to have a chronically occluded circumflex with right-to-left collaterals and moderate mid LAD disease. Because of recurrent symptoms an IV stress test showed inferolateral ischemia. I performed cardiac catheterization on him or/11/16 revealing unchanged anatomy. I thought that his circumflex CTO was contributing to his chest pain and responsible for his Myoview abnormality. I did refer him back to Dr. Eldridge DaceVaranasi who performed a complex circumflex CTO procedure using both antegrade and retrograde approach performing AV groove circumflex and obtuse marginal branch bifurcation stenting. His symptoms have since resolved. He remains on dual antiplatelet therapy.      Runell GessJonathan J. Darthula Desa MD FACP,FACC,FAHA, [facility] 01/25/2017 4:48 PM

## 2017-01-25 NOTE — Assessment & Plan Note (Signed)
History of CAD status post intervention by Dr. Eldridge DaceVaranasi a right dominant RCA using a drug-eluting stent (3.5 mm x 18 mm). The time of his original cath was also found to have a chronically occluded circumflex with right-to-left collaterals and moderate mid LAD disease. Because of recurrent symptoms an IV stress test showed inferolateral ischemia. I performed cardiac catheterization on him or/11/16 revealing unchanged anatomy. I thought that his circumflex CTO was contributing to his chest pain and responsible for his Myoview abnormality. I did refer him back to Dr. Eldridge DaceVaranasi who performed a complex circumflex CTO procedure using both antegrade and retrograde approach performing AV groove circumflex and obtuse marginal branch bifurcation stenting. His symptoms have since resolved. He remains on dual antiplatelet therapy.

## 2017-01-25 NOTE — Assessment & Plan Note (Signed)
History of hyperlipidemia on statin therapy. We'll recheck a lipid and liver profile 

## 2017-01-28 ENCOUNTER — Other Ambulatory Visit: Payer: Self-pay | Admitting: Cardiovascular Disease

## 2017-01-30 ENCOUNTER — Other Ambulatory Visit: Payer: Self-pay | Admitting: Cardiovascular Disease

## 2017-02-17 ENCOUNTER — Other Ambulatory Visit: Payer: Self-pay | Admitting: Cardiovascular Disease

## 2017-02-17 NOTE — Telephone Encounter (Signed)
Rx(s) sent to pharmacy electronically.  

## 2017-02-28 ENCOUNTER — Other Ambulatory Visit: Payer: Self-pay | Admitting: Cardiovascular Disease

## 2017-02-28 NOTE — Telephone Encounter (Signed)
Rx(s) sent to pharmacy electronically.  

## 2017-05-11 DIAGNOSIS — H40053 Ocular hypertension, bilateral: Secondary | ICD-10-CM | POA: Diagnosis not present

## 2017-05-18 DIAGNOSIS — H40053 Ocular hypertension, bilateral: Secondary | ICD-10-CM | POA: Diagnosis not present

## 2017-05-23 DIAGNOSIS — H25013 Cortical age-related cataract, bilateral: Secondary | ICD-10-CM | POA: Diagnosis not present

## 2017-05-23 DIAGNOSIS — H25041 Posterior subcapsular polar age-related cataract, right eye: Secondary | ICD-10-CM | POA: Diagnosis not present

## 2017-05-23 DIAGNOSIS — H2513 Age-related nuclear cataract, bilateral: Secondary | ICD-10-CM | POA: Diagnosis not present

## 2017-07-25 ENCOUNTER — Telehealth: Payer: Self-pay | Admitting: Cardiovascular Disease

## 2017-07-25 MED ORDER — IRBESARTAN 150 MG PO TABS: 150.0000 mg | ORAL_TABLET | Freq: Every day | ORAL | 6 refills | Status: DC

## 2017-07-25 NOTE — Telephone Encounter (Signed)
Called pharmacy. Patient was looking for lisinopril this morning (lisinopril discontinued over a year ago).  Valsartan may be changed to irbesartan 150mg  daily (no HF) but need to talk to patient or wife before sending new Rx to pharmacy

## 2017-07-25 NOTE — Telephone Encounter (Signed)
New message     *STAT* If patient is at the pharmacy, call can be transferred to refill team.   1. Which medications need to be refilled? (please list name of each medication and dose if known) pt wife says his other BP medication, not his amlodipine.  2. Which pharmacy/location (including street and city if local pharmacy) is medication to be sent to[facility] [address]  3. Do they need a 30 day or 90 day supply? 30 day

## 2017-07-25 NOTE — Telephone Encounter (Signed)
Spoke with wife - will switch patient to irbesartan 150 mg.  Pt to check home BP x 2 weeks and call if any concerns.  Wife voiced understanding.

## 2017-08-08 DIAGNOSIS — H18413 Arcus senilis, bilateral: Secondary | ICD-10-CM | POA: Diagnosis not present

## 2017-08-08 DIAGNOSIS — H25013 Cortical age-related cataract, bilateral: Secondary | ICD-10-CM | POA: Diagnosis not present

## 2017-08-08 DIAGNOSIS — H401112 Primary open-angle glaucoma, right eye, moderate stage: Secondary | ICD-10-CM | POA: Diagnosis not present

## 2017-08-08 DIAGNOSIS — H401122 Primary open-angle glaucoma, left eye, moderate stage: Secondary | ICD-10-CM | POA: Diagnosis not present

## 2017-08-08 DIAGNOSIS — H401132 Primary open-angle glaucoma, bilateral, moderate stage: Secondary | ICD-10-CM | POA: Diagnosis not present

## 2017-08-08 DIAGNOSIS — H2511 Age-related nuclear cataract, right eye: Secondary | ICD-10-CM | POA: Diagnosis not present

## 2017-08-08 DIAGNOSIS — H2513 Age-related nuclear cataract, bilateral: Secondary | ICD-10-CM | POA: Diagnosis not present

## 2017-12-05 DIAGNOSIS — H40053 Ocular hypertension, bilateral: Secondary | ICD-10-CM | POA: Diagnosis not present

## 2017-12-05 DIAGNOSIS — H40021 Open angle with borderline findings, high risk, right eye: Secondary | ICD-10-CM | POA: Diagnosis not present

## 2018-01-12 MED FILL — TRAVATAN Z 0.004% EYE DROP: 0.004 | 25 days supply | Qty: 3 | Fill #0

## 2018-01-12 MED FILL — COMBIGAN EYE DROPS: 0.2-0.5 | 25 days supply | Qty: 5 | Fill #0

## 2018-01-21 DIAGNOSIS — J101 Influenza due to other identified influenza virus with other respiratory manifestations: Secondary | ICD-10-CM | POA: Diagnosis not present

## 2018-01-21 DIAGNOSIS — R05 Cough: Secondary | ICD-10-CM | POA: Diagnosis not present

## 2018-02-16 MED FILL — COMBIGAN EYE DROPS: 0.2-0.5 | 25 days supply | Qty: 5 | Fill #0

## 2018-02-16 MED FILL — TRAVATAN Z 0.004% EYE DROP: 0.004 | 25 days supply | Qty: 3 | Fill #1

## 2018-02-16 MED FILL — AMLODIPINE BESYLATE 5 MG TA: 5 | 90 days supply | Qty: 90 | Fill #0

## 2018-02-22 ENCOUNTER — Other Ambulatory Visit: Payer: Self-pay | Admitting: Cardiovascular Disease

## 2018-02-22 NOTE — Telephone Encounter (Signed)
REFILL 

## 2018-02-27 DIAGNOSIS — H2513 Age-related nuclear cataract, bilateral: Secondary | ICD-10-CM | POA: Diagnosis not present

## 2018-02-27 DIAGNOSIS — H18413 Arcus senilis, bilateral: Secondary | ICD-10-CM | POA: Diagnosis not present

## 2018-02-27 DIAGNOSIS — H25013 Cortical age-related cataract, bilateral: Secondary | ICD-10-CM | POA: Diagnosis not present

## 2018-02-27 DIAGNOSIS — H25043 Posterior subcapsular polar age-related cataract, bilateral: Secondary | ICD-10-CM | POA: Diagnosis not present

## 2018-03-21 ENCOUNTER — Other Ambulatory Visit: Payer: Self-pay | Admitting: *Deleted

## 2018-03-21 MED ORDER — CLOPIDOGREL BISULFATE 75 MG PO TABS: 75.0000 mg | ORAL_TABLET | Freq: Every day | ORAL | 0 refills | Status: DC

## 2018-03-21 MED ORDER — IRBESARTAN 150 MG PO TABS: 150.0000 mg | ORAL_TABLET | Freq: Every day | ORAL | 0 refills | Status: DC

## 2018-03-28 MED FILL — DUREZOL 0.05% EYE DROPS: 0.05 | 33 days supply | Qty: 5 | Fill #0

## 2018-03-28 MED FILL — PROLENSA 0.07% EYE DROPS: 0.07 | 45 days supply | Qty: 3 | Fill #0

## 2018-03-28 MED FILL — BESIVANCE 0.6% SUSP: 0.6 | 25 days supply | Qty: 5 | Fill #0

## 2018-04-03 MED FILL — COMBIGAN EYE DROPS: 0.2-0.5 | 25 days supply | Qty: 5 | Fill #0

## 2018-04-03 MED FILL — TRAVATAN Z 0.004% EYE DROP: 0.004 | 25 days supply | Qty: 3 | Fill #2

## 2018-04-07 ENCOUNTER — Other Ambulatory Visit: Payer: Self-pay | Admitting: Cardiovascular Disease

## 2018-04-20 DIAGNOSIS — Z961 Presence of intraocular lens: Secondary | ICD-10-CM | POA: Diagnosis not present

## 2018-04-20 DIAGNOSIS — H2512 Age-related nuclear cataract, left eye: Secondary | ICD-10-CM | POA: Diagnosis not present

## 2018-04-20 DIAGNOSIS — H25013 Cortical age-related cataract, bilateral: Secondary | ICD-10-CM | POA: Diagnosis not present

## 2018-04-20 DIAGNOSIS — H2511 Age-related nuclear cataract, right eye: Secondary | ICD-10-CM | POA: Diagnosis not present

## 2018-04-27 MED FILL — TRAVATAN Z 0.004% EYE DROP: 0.004 | 25 days supply | Qty: 3 | Fill #3

## 2018-04-27 MED FILL — DUREZOL 0.05% EYE DROPS: 0.05 | 33 days supply | Qty: 5 | Fill #0

## 2018-04-27 MED FILL — BESIVANCE 0.6% SUSP: 0.6 | 30 days supply | Qty: 5 | Fill #0

## 2018-04-27 MED FILL — PROLENSA 0.07% EYE DROPS: 0.07 | 60 days supply | Qty: 3 | Fill #0

## 2018-04-28 ENCOUNTER — Other Ambulatory Visit: Payer: Self-pay | Admitting: Cardiovascular Disease

## 2018-05-04 ENCOUNTER — Other Ambulatory Visit: Payer: Self-pay | Admitting: Cardiovascular Disease

## 2018-05-04 DIAGNOSIS — H25012 Cortical age-related cataract, left eye: Secondary | ICD-10-CM | POA: Diagnosis not present

## 2018-05-04 DIAGNOSIS — H2512 Age-related nuclear cataract, left eye: Secondary | ICD-10-CM | POA: Diagnosis not present

## 2018-05-17 ENCOUNTER — Other Ambulatory Visit: Payer: Self-pay | Admitting: Cardiovascular Disease

## 2018-05-17 MED ORDER — IRBESARTAN 150 MG PO TABS: 150.0000 mg | ORAL_TABLET | Freq: Every day | ORAL | 3 refills | Status: DC

## 2018-05-17 MED ORDER — AMLODIPINE BESYLATE 5 MG PO TABS: 5.0000 mg | ORAL_TABLET | Freq: Every day | ORAL | 3 refills | Status: DC

## 2018-05-17 MED ORDER — CLOPIDOGREL BISULFATE 75 MG PO TABS: 75.0000 mg | ORAL_TABLET | Freq: Every day | ORAL | 3 refills | Status: DC

## 2018-05-17 NOTE — Telephone Encounter (Signed)
New message     *STAT* If patient is at the pharmacy, call can be transferred to refill team.   1. Which medications need to be refilled? (please list name of each medication and dose if known) Clopidogrel 75 mg, amliodipine besylate, irbesartan 150 mg  2. Which pharmacy/location (including street and city if local pharmacy) is medication to be sent to? [facility] [address]  3. Do they need a 30 day or 90 day supply? 90 day

## 2018-05-17 NOTE — Telephone Encounter (Signed)
Rx request sent to pharmacy.  

## 2018-05-28 MED FILL — COMBIGAN EYE DROPS: 0.2-0.5 | 25 days supply | Qty: 5 | Fill #0

## 2018-05-30 ENCOUNTER — Encounter: Payer: Self-pay | Admitting: Cardiovascular Disease

## 2018-05-30 ENCOUNTER — Ambulatory Visit: Payer: BLUE CROSS/BLUE SHIELD | Admitting: Cardiovascular Disease

## 2018-05-30 DIAGNOSIS — E785 Hyperlipidemia, unspecified: Secondary | ICD-10-CM | POA: Diagnosis not present

## 2018-05-30 DIAGNOSIS — I2510 Atherosclerotic heart disease of native coronary artery without angina pectoris: Secondary | ICD-10-CM | POA: Diagnosis not present

## 2018-05-30 MED ORDER — ATORVASTATIN CALCIUM 80 MG PO TABS: 80.0000 mg | ORAL_TABLET | Freq: Every day | ORAL | 6 refills | Status: DC

## 2018-05-30 MED ORDER — COENZYME Q-10 100 MG PO CAPS: 200.0000 mg | ORAL_CAPSULE | Freq: Every day | ORAL | 6 refills | Status: AC

## 2018-05-30 NOTE — Assessment & Plan Note (Signed)
History of hyperlipidemia on statin therapy in the past however he discontinued this.  We will restart atorvastatin and check a lipid liver profile in 3 months

## 2018-05-30 NOTE — Assessment & Plan Note (Signed)
History of CAD status post RCA intervention by Dr. Eldridge DaceVaranasi 09/23/2015 with a 3.5 x 18 mm long drug-eluting stent.  At that time he had an occluded circumflex with moderate mid LAD disease and normal LV function.  Because of continued chest pain I performed cardiac catheterization on him 03/30/2015 revealing unchanged anatomy and I referred him to Dr. Eldridge DaceVaranasi for circumflex CTO intervention which was performed using antegrade and retrograde approach 06/03/2015 performing bifurcation stenting.  This resulted in resolution of his symptoms.  He remains on dual antiplatelet therapy and denies chest pain or shortness of breath.

## 2018-05-30 NOTE — Patient Instructions (Signed)
Medication Instructions: Your physician recommends that you continue on your current medications as directed. Please refer to the Current Medication list given to you today.  Restart Atorvastatin 80 mg daily  START Co Q 10--200 mg (2 capsules) daily.   Labwork: Your physician recommends that you return for a FASTING lipid profile and hepatic function panel in 3 months.   Follow-Up: Your physician wants you to follow-up in: 1 year with Dr. Allyson SabalBerry. You will receive a reminder letter in the mail two months in advance. If you don't receive a letter, please call our office to schedule the follow-up appointment.  If you need a refill on your cardiac medications before your next appointment, please call your pharmacy.

## 2018-05-30 NOTE — Progress Notes (Signed)
05/30/2018 [name]   [birth year]  [id number]  Primary Physician Deatra James, MD Primary Cardiologist: Runell Gess MD Nicholes Calamity, [place]  HPI:  Tyler Holder is a 57 y.o.  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 He was referred by his primary care physician for evaluation of chest pain. I last saw him in the office  01/25/2017 and since that time he's had intervention by Dr. Eldridge Dace on the dominant RCA using a drug-eluting stent (3.5 mm x 18 mm). 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 His cardiac risk factors include hypertension and family history (father died of myocardial infarction in his early 80s). Because of recurrent symptoms and a Myoview that showed inferolateral ischemia I performed chronic catheterization on him 03/30/15 revealing unchanged anatomy. I thought it was possible that his circumflex chronic total occlusion was continuing to his chest pain as well as his Myoview abnormality and therefore referred him back to Dr. Eldridge Dace who performed a complex CTO intervention 06/03/15 using both antegrade and retrograde approach. He performed circumflex AV groove and obtuse marginal branch bifurcation stenting. His symptoms have since resolved.  He was working at [employer] regularly however after starting a new job at [employer] this is become less frequent.  He has also gained 6 pounds since I saw him last.  He denies chest pain or shortness of breath.  He unfortunately stopped his statin drug on his own for unclear reasons.    Current Meds  Medication Sig  . acetaminophen (TYLENOL) 500 MG tablet Take 500 mg by mouth every 6 (six) hours as needed (pain).  [name] amLODipine (NORVASC) 5 MG tablet Take 1 tablet (5 mg total) by mouth daily.  [name] aspirin EC 81 MG tablet Take 81 mg by mouth daily after  supper.  [name] atorvastatin (LIPITOR) 80 MG tablet Take 1 tablet (80 mg total) by mouth daily.  . clopidogrel (PLAVIX) 75 MG tablet Take 1 tablet (75 mg total) by mouth daily. NEED OV.  [name] COMBIGAN 0.2-0.5 % ophthalmic solution Place 1 drop into both eyes as directed.  . irbesartan (AVAPRO) 150 MG tablet Take 1 tablet (150 mg total) by mouth daily. NEED OV.  . nitroGLYCERIN (NITROSTAT) 0.4 MG SL tablet Place 1 tablet (0.4 mg total) under the tongue every 5 (five) minutes x 3 doses as needed for chest pain.  . TRAVATAN Z 0.004 % SOLN ophthalmic solution Place 1 drop into both eyes as directed.  . [DISCONTINUED] atorvastatin (LIPITOR) 80 MG tablet TAKE 1 TABLET BY MOUTH EVERY DAY  . [DISCONTINUED] valsartan (DIOVAN) 160 MG tablet TAKE 1 TABLET BY MOUTH DAILY     Allergies  Allergen Reactions  . Imdur [Isosorbide Nitrate] Other (See Comments)    Headache  . Lisinopril Cough    Social History   Socioeconomic History  . Marital status: Married    Spouse name: Not on file  . Number of children: Not on file  . Years of education: Not on file  . Highest education level: Not on file  Occupational History  . Not on file  Social Needs  . Financial resource strain: Not on file  . Food insecurity:    Worry: Not on file    Inability: Not on file  . Transportation needs:    Medical:  Not on file    Non-medical: Not on file  Tobacco Use  . Smoking status: Never Smoker  . Smokeless tobacco: Never Used  Substance and Sexual Activity  . Alcohol use: No    Alcohol/week: 0.0 oz  . Drug use: No  . Sexual activity: Yes  Lifestyle  . Physical activity:    Days per week: Not on file    Minutes per session: Not on file  . Stress: Not on file  Relationships  . Social connections:    Talks on phone: Not on file    Gets together: Not on file    Attends religious service: Not on file    Active member of club or organization: Not on file    Attends meetings of clubs or organizations: Not on file     Relationship status: Not on file  . Intimate partner violence:    Fear of current or ex partner: Not on file    Emotionally abused: Not on file    Physically abused: Not on file    Forced sexual activity: Not on file  Other Topics Concern  . Not on file  Social History Narrative  . Not on file     Review of Systems: General: negative for chills, fever, night sweats or weight changes.  Cardiovascular: negative for chest pain, dyspnea on exertion, edema, orthopnea, palpitations, paroxysmal nocturnal dyspnea or shortness of breath Dermatological: negative for rash Respiratory: negative for cough or wheezing Urologic: negative for hematuria Abdominal: negative for nausea, vomiting, diarrhea, bright red blood per rectum, melena, or hematemesis Neurologic: negative for visual changes, syncope, or dizziness All other systems reviewed and are otherwise negative except as noted above.    Blood pressure 124/80, pulse 60, height 5\' 9"  (1.753 m), weight 203 lb (92.1 kg).  General appearance: alert and no distress Neck: no adenopathy, no carotid bruit, no JVD, supple, symmetrical, trachea midline and thyroid not enlarged, symmetric, no tenderness/mass/nodules Lungs: clear to auscultation bilaterally Heart: regular rate and rhythm, S1, S2 normal, no murmur, click, rub or gallop Extremities: extremities normal, atraumatic, no cyanosis or edema Pulses: 2+ and symmetric Skin: Skin color, texture, turgor normal. No rashes or lesions Neurologic: Alert and oriented X 3, normal strength and tone. Normal symmetric reflexes. Normal coordination and gait  EKG sinus rhythm at 60 small lateral Q waves.  I personally reviewed this EKG.  ASSESSMENT AND PLAN:   Hyperlipidemia LDL goal <70 History of hyperlipidemia on statin therapy in the past however he discontinued this.  We will restart atorvastatin and check a lipid liver profile in 3 months  Essential hypertension History of essential hypertension  her blood pressure measured today at 4/80.  He is on amlodipine and Avapro.  Continue current meds at current dosing.  CAD, multiple vessel, culprit RCA -prox with DES placed, total LCX-old, mod LAD- FFR not Hemodynamically signficant 06/06/14  History of CAD status post RCA intervention by Dr. Eldridge Dace 09/23/2015 with a 3.5 x 18 mm long drug-eluting stent.  At that time he had an occluded circumflex with moderate mid LAD disease and normal LV function.  Because of continued chest pain I performed cardiac catheterization on him 03/30/2015 revealing unchanged anatomy and I referred him to Dr. Eldridge Dace for circumflex CTO intervention which was performed using antegrade and retrograde approach 06/03/2015 performing bifurcation stenting.  This resulted in resolution of his symptoms.  He remains on dual antiplatelet therapy and denies chest pain or shortness of breath.      Christiane Ha  Erlene QuanJ. Jodelle Fausto MD FACP,FACC,FAHA, [facility] 05/30/2018 4:24 PM

## 2018-05-30 NOTE — Assessment & Plan Note (Signed)
History of essential hypertension her blood pressure measured today at 4/80.  He is on amlodipine and Avapro.  Continue current meds at current dosing.

## 2018-06-26 MED FILL — COMBIGAN EYE DROPS: 0.2-0.5 | 25 days supply | Qty: 5 | Fill #1

## 2018-06-27 MED FILL — LATANOPROST 0.005% EYE DRP: 0.005 | 25 days supply | Qty: 3 | Fill #0

## 2018-07-23 MED FILL — COMBIGAN EYE DROPS: 0.2-0.5 | 25 days supply | Qty: 5 | Fill #2

## 2018-08-07 DIAGNOSIS — H40053 Ocular hypertension, bilateral: Secondary | ICD-10-CM | POA: Diagnosis not present

## 2018-08-24 MED FILL — COMBIGAN EYE DROPS: 0.2-0.5 | 25 days supply | Qty: 5 | Fill #3

## 2018-08-24 MED FILL — LATANOPROST 0.005% EYE DRP: 0.005 | 25 days supply | Qty: 3 | Fill #1

## 2018-09-13 ENCOUNTER — Encounter: Payer: Self-pay | Admitting: *Deleted

## 2018-09-19 DIAGNOSIS — E785 Hyperlipidemia, unspecified: Secondary | ICD-10-CM | POA: Diagnosis not present

## 2018-09-19 LAB — HEPATIC FUNCTION PANEL
ALK PHOS: 64 IU/L (ref 39–117)
ALT: 34 IU/L (ref 0–44)
AST: 19 IU/L (ref 0–40)
Albumin: 4.5 g/dL (ref 3.5–5.5)
Bilirubin Total: 0.7 mg/dL (ref 0.0–1.2)
Bilirubin, Direct: 0.17 mg/dL (ref 0.00–0.40)
TOTAL PROTEIN: 6.4 g/dL (ref 6.0–8.5)

## 2018-09-19 LAB — LIPID PANEL
CHOLESTEROL TOTAL: 136 mg/dL (ref 100–199)
Chol/HDL Ratio: 5.2 ratio — ABNORMAL HIGH (ref 0.0–5.0)
HDL: 26 mg/dL — AB (ref >39)
LDL CALC: 64 mg/dL (ref 0–99)
TRIGLYCERIDES: 230 mg/dL — AB (ref 0–149)
VLDL Cholesterol Cal: 46 mg/dL — ABNORMAL HIGH (ref 5–40)

## 2018-09-20 ENCOUNTER — Encounter: Payer: Self-pay | Admitting: *Deleted

## 2018-09-21 MED FILL — COMBIGAN EYE DROPS: 0.2-0.5 | 25 days supply | Qty: 5 | Fill #4

## 2018-09-21 MED FILL — LATANOPROST 0.005% EYE DRP: 0.005 | 25 days supply | Qty: 3 | Fill #0

## 2018-10-12 DIAGNOSIS — H40053 Ocular hypertension, bilateral: Secondary | ICD-10-CM | POA: Diagnosis not present

## 2018-10-19 MED FILL — COMBIGAN EYE DROPS: 0.2-0.5 | 25 days supply | Qty: 5 | Fill #5

## 2018-11-12 MED FILL — COMBIGAN EYE DROPS: 0.2-0.5 | 25 days supply | Qty: 5 | Fill #6

## 2018-11-19 DIAGNOSIS — H40053 Ocular hypertension, bilateral: Secondary | ICD-10-CM | POA: Diagnosis not present

## 2018-11-19 MED FILL — VYZULTA 0.024 % SOLN: 0.024 | 50 days supply | Qty: 5 | Fill #0

## 2018-12-13 MED FILL — COMBIGAN EYE DROPS: 0.2-0.5 | 25 days supply | Qty: 5 | Fill #7

## 2019-01-10 ENCOUNTER — Other Ambulatory Visit: Payer: Self-pay | Admitting: Cardiovascular Disease

## 2019-01-10 MED FILL — COMBIGAN EYE DROPS: 0.2-0.5 | 25 days supply | Qty: 5 | Fill #8

## 2019-01-15 MED FILL — BRIMONIDINE TARTRATE 0.2 %: 0.2 | 25 days supply | Qty: 5 | Fill #0

## 2019-01-15 MED FILL — TIMOLOL 0.5% EYE DROPS: 0.5 | 25 days supply | Qty: 5 | Fill #0

## 2019-01-28 ENCOUNTER — Other Ambulatory Visit: Payer: Self-pay | Admitting: Cardiovascular Disease

## 2019-01-28 MED ORDER — AMLODIPINE BESYLATE 5 MG PO TABS: 5.0000 mg | ORAL_TABLET | Freq: Every day | ORAL | 1 refills | Status: DC

## 2019-01-28 MED ORDER — IRBESARTAN 150 MG PO TABS: 150.0000 mg | ORAL_TABLET | Freq: Every day | ORAL | 1 refills | Status: DC

## 2019-01-28 MED ORDER — CLOPIDOGREL BISULFATE 75 MG PO TABS: 75.0000 mg | ORAL_TABLET | Freq: Every day | ORAL | 1 refills | Status: DC

## 2019-01-28 MED ORDER — ATORVASTATIN CALCIUM 80 MG PO TABS: 80.0000 mg | ORAL_TABLET | Freq: Every day | ORAL | 1 refills | Status: DC

## 2019-01-28 NOTE — Telephone Encounter (Signed)
Rx(s) sent to pharmacy electronically.  

## 2019-01-28 NOTE — Telephone Encounter (Signed)
New Message    *STAT* If patient is at the pharmacy, call can be transferred to refill team.   1. Which medications need to be refilled? (please list name of each medication and dose if known) Atorvastatin 80mg , Clopidogrel 75mg , Amlodipine 5mg , Irbesartan 150mg   2. Which pharmacy/location (including street and city if local pharmacy) is medication to be sent to? [facility] on [address]   3. Do they need a 30 day or 90 day supply? 90 Days  Pt says he needs them filled asap because he wont have insurance after Friday

## 2019-01-30 MED FILL — VYZULTA 0.024 % SOLN: 0.024 | 50 days supply | Qty: 5 | Fill #1

## 2019-01-30 MED FILL — COMBIGAN EYE DROPS: 0.2-0.5 | 25 days supply | Qty: 5 | Fill #9

## 2019-01-30 MED FILL — LATANOPROST 0.005% EYE DRP: 0.005 | 25 days supply | Qty: 3 | Fill #1

## 2019-05-23 MED FILL — COMBIGAN EYE DROPS: 0.2-0.5 | 25 days supply | Qty: 5 | Fill #0

## 2019-05-23 MED FILL — LATANOPROST 0.005% EYE DRP: 0.005 | 30 days supply | Qty: 3 | Fill #0

## 2019-06-05 ENCOUNTER — Ambulatory Visit: Payer: BLUE CROSS/BLUE SHIELD | Admitting: Cardiovascular Disease

## 2019-06-05 ENCOUNTER — Encounter: Payer: Self-pay | Admitting: Cardiovascular Disease

## 2019-06-05 ENCOUNTER — Ambulatory Visit: Payer: Commercial Managed Care - PPO | Admitting: Cardiovascular Disease

## 2019-06-05 ENCOUNTER — Telehealth: Payer: Self-pay

## 2019-06-05 ENCOUNTER — Other Ambulatory Visit: Payer: Self-pay

## 2019-06-05 DIAGNOSIS — E785 Hyperlipidemia, unspecified: Secondary | ICD-10-CM

## 2019-06-05 DIAGNOSIS — I2510 Atherosclerotic heart disease of native coronary artery without angina pectoris: Secondary | ICD-10-CM

## 2019-06-05 DIAGNOSIS — I10 Essential (primary) hypertension: Secondary | ICD-10-CM

## 2019-06-05 NOTE — Assessment & Plan Note (Signed)
History of hyperlipidemia on atorvastatin with lipid profile performed 09/19/2018 revealing a total cholesterol 136, LDL of 64 and HDL of 26.

## 2019-06-05 NOTE — Progress Notes (Signed)
06/05/2019 Mollie Germany   August 07, 1961  [id number]  Primary Physician Donald Prose, MD Primary Cardiologist: Lorretta Harp MD Lupe Carney, [place]  HPI:  Tyler Holder is a 58 y.o.  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 He was referred by his primary care physician for evaluation of chest pain. I last saw him in the office  05/30/2018 . He has  had intervention by Dr. Irish Lack on the dominant RCA using a drug-eluting stent (3.5 mm x 18 mm). 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 His cardiac risk factors include hypertension and family history (father died of myocardial infarction in his early 63s). Because of recurrent symptoms and a Myoview that showed inferolateral ischemia I performed chronic catheterization on him 03/30/15 revealing unchanged anatomy. I thought it was possible that his circumflex chronic total occlusion was continuing to his chest pain as well as his Myoview abnormality and therefore referred him back to Dr. Irish Lack who performed a complex CTO intervention 06/03/15 using both antegrade and retrograde approach. He performed circumflex AV groove and obtuse marginal branch bifurcation stenting. His symptoms have since resolved.   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  He was working out fairly frequently until COVID-19.  He is asymptomatic currently.  Current Meds  Medication Sig  . acetaminophen (TYLENOL) 500 MG tablet Take 500 mg by mouth every 6 (six) hours as needed (pain).  [name] amLODipine (NORVASC) 5 MG tablet Take 1 tablet (5 mg total) by mouth daily.  [name] aspirin EC 81 MG tablet Take 81 mg by mouth daily after supper.  [name] atorvastatin (LIPITOR) 80 MG tablet Take 1 tablet (80 mg  total) by mouth daily at 6 PM.  . clopidogrel (PLAVIX) 75 MG tablet Take 1 tablet (75 mg total) by mouth daily.  . Coenzyme Q-10 100 MG capsule Take 2 capsules (200 mg total) by mouth daily.  . COMBIGAN 0.2-0.5 % ophthalmic solution Place 1 drop into both eyes as directed.  . irbesartan (AVAPRO) 150 MG tablet Take 1 tablet (150 mg total) by mouth daily.  . nitroGLYCERIN (NITROSTAT) 0.4 MG SL tablet Place 1 tablet (0.4 mg total) under the tongue every 5 (five) minutes x 3 doses as needed for chest pain.  . TRAVATAN Z 0.004 % SOLN ophthalmic solution Place 1 drop into both eyes as directed.     Allergies  Allergen Reactions  . Imdur [Isosorbide Nitrate] Other (See Comments)    Headache  . Lisinopril Cough    Social History   Socioeconomic History  . Marital status: Married    Spouse name: Not on file  . Number of children: Not on file  . Years of education: Not on file  . Highest education level: Not on file  Occupational History  . Not on file  Social Needs  . Financial resource strain: Not on file  . Food insecurity    Worry: Not on file    Inability: Not on file  . Transportation needs    Medical: Not on file    Non-medical: Not on file  Tobacco Use  . Smoking status: Never Smoker  . Smokeless tobacco: Never Used  Substance and  Sexual Activity  . Alcohol use: No    Alcohol/week: 0.0 standard drinks  . Drug use: No  . Sexual activity: Yes  Lifestyle  . Physical activity    Days per week: Not on file    Minutes per session: Not on file  . Stress: Not on file  Relationships  . Social Musicianconnections    Talks on phone: Not on file    Gets together: Not on file    Attends religious service: Not on file    Active member of club or organization: Not on file    Attends meetings of clubs or organizations: Not on file    Relationship status: Not on file  . Intimate partner violence    Fear of current or ex partner: Not on file    Emotionally abused: Not on file     Physically abused: Not on file    Forced sexual activity: Not on file  Other Topics Concern  . Not on file  Social History Narrative  . Not on file     Review of Systems: General: negative for chills, fever, night sweats or weight changes.  Cardiovascular: negative for chest pain, dyspnea on exertion, edema, orthopnea, palpitations, paroxysmal nocturnal dyspnea or shortness of breath Dermatological: negative for rash Respiratory: negative for cough or wheezing Urologic: negative for hematuria Abdominal: negative for nausea, vomiting, diarrhea, bright red blood per rectum, melena, or hematemesis Neurologic: negative for visual changes, syncope, or dizziness All other systems reviewed and are otherwise negative except as noted above.    Blood pressure 114/68, pulse 73, temperature 98.5 F (36.9 C), height 5' 9.5" (1.765 m), weight 207 lb (93.9 kg).  General appearance: alert and no distress Neck: no adenopathy, no carotid bruit, no JVD, supple, symmetrical, trachea midline and thyroid not enlarged, symmetric, no tenderness/mass/nodules Lungs: clear to auscultation bilaterally Heart: regular rate and rhythm, S1, S2 normal, no murmur, click, rub or gallop Extremities: extremities normal, atraumatic, no cyanosis or edema Pulses: 2+ and symmetric Skin: Skin color, texture, turgor normal. No rashes or lesions Neurologic: Alert and oriented X 3, normal strength and tone. Normal symmetric reflexes. Normal coordination and gait  EKG normal sinus rhythm at 73 with left axis deviation.  I personally reviewed this EKG.  ASSESSMENT AND PLAN:   Hyperlipidemia LDL goal <70 History of hyperlipidemia on atorvastatin with lipid profile performed 09/19/2018 revealing a total cholesterol 136, LDL of 64 and HDL of 26.  Essential hypertension History of essential hypertension with blood pressure measured today 114/68.  He is on amlodipine and Avapro.  CAD, multiple vessel, culprit RCA -prox with DES  placed, total LCX-old, mod LAD- FFR not Hemodynamically signficant 06/06/14  History of CAD status post intervention by Dr. Eldridge DaceVaranasi of his dominant RCA using a 3.5 x 18 mm long stent.  At the time of his original cath he was found to have a chronically occluded circumflex with right to left collaterals and moderate LAD disease.  Because of recurrent chest pain Myoview stress test was performed that showed inferolateral ischemia which led to cardiac catheterization 03/30/2015 revealing unchanged anatomy.  Ultimately, Dr. Eldridge DaceVaranasi open his complex circumflex CTO 06/03/2015 using both antegrade and retrograde approach doing bifurcation stenting of the AV groove circumflex and obtuse marginal branches.  Symptoms ultimately resolved.  He works out frequently without limitation or symptoms.      Runell GessJonathan J.  MD FACP,FACC,FAHA, [facility] 06/05/2019 2:32 PM

## 2019-06-05 NOTE — Telephone Encounter (Signed)
Spoke with pt and informed that appt time would need to be changed. Pt requested 1400 time slot. Appt changed in epic appts. Advised pt that he should wear mask and that if he is being accompanied by someone, they will have to wait in the car. Pt verbalized understanding

## 2019-06-05 NOTE — Assessment & Plan Note (Signed)
History of CAD status post intervention by Dr. Irish Lack of his dominant RCA using a 3.5 x 18 mm long stent.  At the time of his original cath he was found to have a chronically occluded circumflex with right to left collaterals and moderate LAD disease.  Because of recurrent chest pain Myoview stress test was performed that showed inferolateral ischemia which led to cardiac catheterization 03/30/2015 revealing unchanged anatomy.  Ultimately, Dr. Irish Lack open his complex circumflex CTO 06/03/2015 using both antegrade and retrograde approach doing bifurcation stenting of the AV groove circumflex and obtuse marginal branches.  Symptoms ultimately resolved.  He works out frequently without limitation or symptoms.

## 2019-06-05 NOTE — Patient Instructions (Signed)

## 2019-06-05 NOTE — Assessment & Plan Note (Signed)
History of essential hypertension with blood pressure measured today 114/68.  He is on amlodipine and Avapro.

## 2019-06-26 MED FILL — COMBIGAN EYE DROPS: 0.2-0.5 | 25 days supply | Qty: 5 | Fill #1

## 2019-06-26 MED FILL — LATANOPROST 0.005% EYE DRP: 0.005 | 30 days supply | Qty: 3 | Fill #1

## 2019-07-24 MED FILL — COMBIGAN EYE DROPS: 0.2-0.5 | 25 days supply | Qty: 5 | Fill #2

## 2019-07-24 MED FILL — LATANOPROST 0.005% EYE DRP: 0.005 | 30 days supply | Qty: 3 | Fill #0

## 2019-08-06 MED FILL — TIMOLOL MALEATE 0.5 % (DAIL: 0.5 | 25 days supply | Qty: 5 | Fill #0

## 2019-08-16 ENCOUNTER — Other Ambulatory Visit: Payer: Self-pay | Admitting: Cardiovascular Disease

## 2019-08-20 ENCOUNTER — Other Ambulatory Visit: Payer: Self-pay

## 2019-08-20 MED ORDER — IRBESARTAN 150 MG PO TABS: 150.0000 mg | ORAL_TABLET | Freq: Every day | ORAL | 1 refills | Status: DC

## 2019-08-20 MED ORDER — CLOPIDOGREL BISULFATE 75 MG PO TABS: 75.0000 mg | ORAL_TABLET | Freq: Every day | ORAL | 1 refills | Status: DC

## 2019-09-05 MED FILL — LATANOPROST 0.005% EYE DRP: 0.005 | 30 days supply | Qty: 3 | Fill #1

## 2019-09-05 MED FILL — TIMOLOL MALEATE 0.5 % (DAIL: 0.5 | 25 days supply | Qty: 5 | Fill #1

## 2019-10-08 MED FILL — LATANOPROST 0.005% OPTH SOL: 0.005 | 25 days supply | Qty: 3 | Fill #0

## 2019-10-08 MED FILL — TIMOLOL MALEATE 0.5 % (DAIL: 0.5 | 25 days supply | Qty: 5 | Fill #2

## 2019-11-07 MED FILL — TIMOLOL MALEATE 0.5 % (DAIL: 0.5 | 25 days supply | Qty: 5 | Fill #3

## 2019-11-08 MED FILL — LATANOPROST 0.005% OPTH SOL: 0.005 | 25 days supply | Qty: 3 | Fill #1

## 2019-11-18 ENCOUNTER — Other Ambulatory Visit: Payer: Self-pay | Admitting: Cardiovascular Disease

## 2019-12-11 MED FILL — TIMOLOL MALEATE 0.5 % (DAIL: 0.5 | 25 days supply | Qty: 5 | Fill #4

## 2019-12-16 MED FILL — LATANOPROST 0.005% OPTH SOL: 0.005 | 30 days supply | Qty: 3 | Fill #0

## 2020-01-15 MED FILL — TIMOLOL MALEATE 0.5 % (DAIL: 0.5 | 25 days supply | Qty: 5 | Fill #5

## 2020-01-15 MED FILL — LATANOPROST 0.005% OPTH SOL: 0.005 | 30 days supply | Qty: 3 | Fill #1

## 2020-02-18 ENCOUNTER — Other Ambulatory Visit: Payer: Self-pay | Admitting: Cardiovascular Disease

## 2020-02-18 ENCOUNTER — Other Ambulatory Visit (HOSPITAL_COMMUNITY): Payer: Self-pay | Admitting: Ophthalmology

## 2020-02-18 MED FILL — LATANOPROST 0.005% OPTH SOL: 0.005 | 25 days supply | Qty: 3 | Fill #0

## 2020-02-18 MED FILL — TIMOLOL MALEATE 0.5 % (DAIL: 0.5 | 25 days supply | Qty: 5 | Fill #6

## 2020-03-18 MED FILL — TIMOLOL MALEATE 0.5 % (DAIL: 0.5 | 25 days supply | Qty: 5 | Fill #7

## 2020-03-19 MED FILL — LATANOPROST 0.005% OPTH SOL: 0.005 | 25 days supply | Qty: 3 | Fill #1

## 2020-04-22 MED FILL — LATANOPROST 0.005% OPTH SOL: 0.005 | 30 days supply | Qty: 3 | Fill #2

## 2020-04-22 MED FILL — TIMOLOL MALEATE 0.5 % (DAIL: 0.5 | 25 days supply | Qty: 5 | Fill #8

## 2020-05-18 ENCOUNTER — Other Ambulatory Visit: Payer: Self-pay | Admitting: Cardiovascular Disease

## 2020-05-20 MED FILL — TIMOLOL MALEATE 0.5 % (DAIL: 0.5 | 25 days supply | Qty: 5 | Fill #9

## 2020-05-20 MED FILL — LATANOPROST 0.005% OPTH SOL: 0.005 | 30 days supply | Qty: 3 | Fill #3

## 2020-05-24 ENCOUNTER — Other Ambulatory Visit: Payer: Self-pay | Admitting: Cardiovascular Disease

## 2020-06-10 ENCOUNTER — Encounter: Payer: Self-pay | Admitting: Cardiovascular Disease

## 2020-06-10 ENCOUNTER — Other Ambulatory Visit: Payer: Self-pay

## 2020-06-10 ENCOUNTER — Ambulatory Visit: Payer: Commercial Managed Care - PPO | Admitting: Cardiovascular Disease

## 2020-06-10 VITALS — BP 132/82 | HR 57 | Ht 69.5 in | Wt 208.0 lb

## 2020-06-10 DIAGNOSIS — I10 Essential (primary) hypertension: Secondary | ICD-10-CM | POA: Diagnosis not present

## 2020-06-10 DIAGNOSIS — I2510 Atherosclerotic heart disease of native coronary artery without angina pectoris: Secondary | ICD-10-CM

## 2020-06-10 DIAGNOSIS — Z8249 Family history of ischemic heart disease and other diseases of the circulatory system: Secondary | ICD-10-CM | POA: Diagnosis not present

## 2020-06-10 DIAGNOSIS — I200 Unstable angina: Secondary | ICD-10-CM | POA: Diagnosis not present

## 2020-06-10 DIAGNOSIS — E785 Hyperlipidemia, unspecified: Secondary | ICD-10-CM

## 2020-06-10 NOTE — Assessment & Plan Note (Signed)
History of hyperlipidemia on statin therapy.  His last lipid profile was from 2 years ago.  I am going to recheck a fasting lipid liver profile.

## 2020-06-10 NOTE — Patient Instructions (Signed)
Medication Instructions:  Your physician recommends that you continue on your current medications as directed. Please refer to the Current Medication list given to you today.  *If you need a refill on your cardiac medications before your next appointment, please call your pharmacy*   Lab Work: Your physician recommends that you return for a FASTING lipid profile and hepatic function panel in 1-2 weeks. You do not need an appointment to come to our office lab. Please bring your lab slips with you when you return. Nothing to eat or drink after midnight the night before.  If you have labs (blood work) drawn today and your tests are completely normal, you will receive your results only by: [name] MyChart Message (if you have MyChart) OR . A paper copy in the mail If you have any lab test that is abnormal or we need to change your treatment, we will call you to review the results.   Follow-Up: At [facility], you and your health needs are our priority.  As part of our continuing mission to provide you with exceptional heart care, we have created designated Provider Care Teams.  These Care Teams include your primary Cardiologist (physician) and Advanced Practice Providers (APPs -  Physician Assistants and Nurse Practitioners) who all work together to provide you with the care you need, when you need it.  We recommend signing up for the patient portal called "MyChart".  Sign up information is provided on this After Visit Summary.  MyChart is used to connect with patients for Virtual Visits (Telemedicine).  Patients are able to view lab/test results, encounter notes, upcoming appointments, etc.  Non-urgent messages can be sent to your provider as well.   To learn more about what you can do with MyChart, go to [email or web address].    Your next appointment:   12 month(s)  The format for your next appointment:   In Person  Provider:   You may see Nanetta Batty, MD or one of the following Advanced  Practice Providers on your designated Care Team:    Corine Shelter, PA-C  [place], [place]  Edd Fabian, [place]    Other Instructions Please call our office 2 months in advance to schedule your follow-up appointment with Dr. Allyson Sabal.

## 2020-06-10 NOTE — Progress Notes (Signed)
06/10/2020 Tyler Holder   1961-07-18  [id number]  Primary Physician Deatra James, MD Primary Cardiologist: Runell Gess MD Nicholes [place]am[place], [place]  HPI:  Tyler Holder is a 59 y.o.   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 He was referred by his primary care physician for evaluation of chest pain. I last saw him in the office  05/31/2019. He has  had intervention by Dr. Eldridge Dace on the dominant RCA using a drug-eluting stent (3.5 mm x 18 mm). 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 His cardiac risk factors include hypertension and family history (father died of myocardial infarction in his early 33s). Because of recurrent symptoms and a Myoview that showed inferolateral ischemia I performed chronic catheterization on him 03/30/15 revealing unchanged anatomy. I thought it was possible that his circumflex chronic total occlusion was continuing to his chest pain as well as his Myoview abnormality and therefore referred him back to Dr. Eldridge Dace who performed a complex CTO intervention 06/03/15 using both antegrade and retrograde approach. He performed circumflex AV groove and obtuse marginal branch bifurcation stenting. His symptoms have since resolved.  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  He was working out fairly frequently until COVID-19, but has not worked out for the last year.   Since I saw him a year ago he remains asymptomatic.  Denies chest pain or shortness of breath.   Current Meds  Medication Sig  . acetaminophen (TYLENOL) 500 MG tablet Take 500 mg by mouth every 6 (six) hours as needed (pain).  [name] amLODipine (NORVASC) 5 MG tablet Take 1 tablet (5 mg total) by mouth daily.  [name] aspirin EC 81 MG tablet Take 81 mg by mouth daily after  supper.  [name] atorvastatin (LIPITOR) 80 MG tablet Take 1 tablet (80 mg total) by mouth daily.  . clopidogrel (PLAVIX) 75 MG tablet Take 1 tablet (75 mg total) by mouth daily.  . Coenzyme Q-10 100 MG capsule Take 2 capsules (200 mg total) by mouth daily.  . COMBIGAN 0.2-0.5 % ophthalmic solution Place 1 drop into both eyes as directed.  . irbesartan (AVAPRO) 150 MG tablet Take 1 tablet (150 mg total) by mouth daily.  . nitroGLYCERIN (NITROSTAT) 0.4 MG SL tablet Place 1 tablet (0.4 mg total) under the tongue every 5 (five) minutes x 3 doses as needed for chest pain.  . TRAVATAN Z 0.004 % SOLN ophthalmic solution Place 1 drop into both eyes as directed.     Allergies  Allergen Reactions  . Imdur [Isosorbide Nitrate] Other (See Comments)    Headache  . Lisinopril Cough    Social History   Socioeconomic History  . Marital status: Married    Spouse name: Not on file  . Number of children: Not on file  . Years of education: Not on file  . Highest education level: Not on file  Occupational History  . Not on file  Tobacco Use  . Smoking status: Never Smoker  . Smokeless tobacco: Never Used  Substance and Sexual Activity  . Alcohol use: No    Alcohol/week: 0.0 standard drinks  . Drug use: No  . Sexual activity: Yes  Other Topics Concern  . Not on file  Social History  Narrative  . Not on file   Social Determinants of Health   Financial Resource Strain:   . Difficulty of Paying Living Expenses:   Food Insecurity:   . Worried About Charity fundraiser in the Last Year:   . Arboriculturist in the Last Year:   Transportation Needs:   . Film/video editor (Medical):   [name] Lack of Transportation (Non-Medical):   Physical Activity:   . Days of Exercise per Week:   . Minutes of Exercise per Session:   Stress:   . Feeling of Stress :   Social Connections:   . Frequency of Communication with Friends and Family:   . Frequency of Social Gatherings with Friends and Family:   .  Attends Religious Services:   . Active Member of Clubs or Organizations:   . Attends Archivist Meetings:   [name] Marital Status:   Intimate Partner Violence:   . Fear of Current or Ex-Partner:   . Emotionally Abused:   [name] Physically Abused:   . Sexually Abused:      Review of Systems: General: negative for chills, fever, night sweats or weight changes.  Cardiovascular: negative for chest pain, dyspnea on exertion, edema, orthopnea, palpitations, paroxysmal nocturnal dyspnea or shortness of breath Dermatological: negative for rash Respiratory: negative for cough or wheezing Urologic: negative for hematuria Abdominal: negative for nausea, vomiting, diarrhea, bright red blood per rectum, melena, or hematemesis Neurologic: negative for visual changes, syncope, or dizziness All other systems reviewed and are otherwise negative except as noted above.    Blood pressure 132/82, pulse (!) 57, height 5' 9.5" (1.765 m), weight 208 lb (94.3 kg), SpO2 98 %.  General appearance: alert and no distress Neck: no adenopathy, no carotid bruit, no JVD, supple, symmetrical, trachea midline and thyroid not enlarged, symmetric, no tenderness/mass/nodules Lungs: clear to auscultation bilaterally Heart: regular rate and rhythm, S1, S2 normal, no murmur, click, rub or gallop Extremities: extremities normal, atraumatic, no cyanosis or edema Pulses: 2+ and symmetric Skin: Skin color, texture, turgor normal. No rashes or lesions Neurologic: Alert and oriented X 3, normal strength and tone. Normal symmetric reflexes. Normal coordination and gait  EKG sinus bradycardia 57 with nonspecific ST and T wave changes.  Personally reviewed this EKG.  ASSESSMENT AND PLAN:   Hyperlipidemia LDL goal <70 History of hyperlipidemia on statin therapy.  His last lipid profile was from 2 years ago.  I am going to recheck a fasting lipid liver profile.  Essential hypertension History of essential hypertension a blood  pressure measured today 132/82.  He is on amlodipine and Avapro.  CAD, multiple vessel, culprit RCA -prox with DES placed, total LCX-old, mod LAD- FFR not Hemodynamically signficant 06/06/14  History of CAD status post RCA intervention by Dr. Irish Lack using a 3.5 mm x 18 mm long drug-eluting stent remotely.  At the time he was found to have a chronically occluded circumflex with right to left collaterals and moderate mid LAD disease.  Because of her current symptoms a Myoview stress test that showed inferolateral ischemia I recath him 03/30/2015 revealing unchanged anatomy.  I thought it was possible that his symptoms were related to his circumflex CTO.  I ultimately referred him back to Dr. Irish Lack who performed a complex circumflex CTO intervention using both antegrade and retrograde approach.  He performed AV groove, and obtuse marginal branch bifurcation stenting.  His symptoms resolved.  Been asymptomatic since I last saw him.      Roderic Palau  Erlene Quan MD FACP,FACC,FAHA, [facility] 06/10/2020 11:58 AM

## 2020-06-10 NOTE — Assessment & Plan Note (Signed)
History of essential hypertension a blood pressure measured today 132/82.  He is on amlodipine and Avapro.

## 2020-06-10 NOTE — Assessment & Plan Note (Signed)
History of CAD status post RCA intervention by Dr. Eldridge Dace using a 3.5 mm x 18 mm long drug-eluting stent remotely.  At the time he was found to have a chronically occluded circumflex with right to left collaterals and moderate mid LAD disease.  Because of her current symptoms a Myoview stress test that showed inferolateral ischemia I recath him 03/30/2015 revealing unchanged anatomy.  I thought it was possible that his symptoms were related to his circumflex CTO.  I ultimately referred him back to Dr. Eldridge Dace who performed a complex circumflex CTO intervention using both antegrade and retrograde approach.  He performed AV groove, and obtuse marginal branch bifurcation stenting.  His symptoms resolved.  Been asymptomatic since I last saw him.

## 2020-06-25 LAB — LIPID PANEL
Chol/HDL Ratio: 4.1 ratio (ref 0.0–5.0)
Cholesterol, Total: 116 mg/dL (ref 100–199)
HDL: 28 mg/dL — ABNORMAL LOW (ref >39)
LDL Chol Calc (NIH): 64 mg/dL (ref 0–99)
Triglycerides: 134 mg/dL (ref 0–149)
VLDL Cholesterol Cal: 24 mg/dL (ref 5–40)

## 2020-06-25 LAB — HEPATIC FUNCTION PANEL
ALT: 34 IU/L (ref 0–44)
AST: 23 IU/L (ref 0–40)
Albumin: 4.4 g/dL (ref 3.8–4.9)
Alkaline Phosphatase: 70 IU/L (ref 48–121)
Bilirubin Total: 0.8 mg/dL (ref 0.0–1.2)
Bilirubin, Direct: 0.19 mg/dL (ref 0.00–0.40)
Total Protein: 6.6 g/dL (ref 6.0–8.5)

## 2020-06-25 MED FILL — LATANOPROST 0.005% OPTH SOL: 0.005 | 30 days supply | Qty: 3 | Fill #4

## 2020-06-25 MED FILL — TIMOLOL MALEATE 0.5 % (DAIL: 0.5 | 25 days supply | Qty: 5 | Fill #10

## 2020-07-23 MED FILL — LATANOPROST 0.005% OPTH SOL: 0.005 | 30 days supply | Qty: 3 | Fill #5

## 2020-07-23 MED FILL — TIMOLOL MALEATE 0.5 % (DAIL: 0.5 | 25 days supply | Qty: 5 | Fill #11

## 2020-08-25 ENCOUNTER — Other Ambulatory Visit (HOSPITAL_COMMUNITY): Payer: Self-pay | Admitting: Ophthalmology

## 2020-08-25 MED FILL — LATANOPROST 0.005% OPTH SOL: 0.005 | 30 days supply | Qty: 3 | Fill #6

## 2020-08-25 MED FILL — TIMOLOL MALEATE 0.5 % (DAIL: 0.5 | 25 days supply | Qty: 5 | Fill #0

## 2020-09-23 MED FILL — LATANOPROST 0.005% OPTH SOL: 0.005 | 30 days supply | Qty: 3 | Fill #7

## 2020-09-23 MED FILL — TIMOLOL MALEATE 0.5 % (DAIL: 0.5 | 25 days supply | Qty: 5 | Fill #1

## 2020-10-16 MED FILL — TIMOLOL MALEATE 0.5 % (DAIL: 0.5 | 25 days supply | Qty: 5 | Fill #2

## 2020-10-16 MED FILL — LATANOPROST 0.005% OPTH SOL: 0.005 | 30 days supply | Qty: 3 | Fill #8

## 2020-11-11 MED FILL — LATANOPROST 0.005% OPTH SOL: 0.005 | 30 days supply | Qty: 3 | Fill #9

## 2020-11-11 MED FILL — TIMOLOL MALEATE 0.5 % (DAIL: 0.5 | 25 days supply | Qty: 5 | Fill #3

## 2020-11-20 MED FILL — LATANOPROST 0.005% OPTH SOL: 0.005 | 30 days supply | Qty: 3 | Fill #9

## 2020-11-20 MED FILL — TIMOLOL MALEATE 0.5 % (DAIL: 0.5 | 25 days supply | Qty: 5 | Fill #3

## 2020-12-17 MED FILL — LATANOPROST 0.005% EYE DRP: 0.005 | 30 days supply | Qty: 3 | Fill #10

## 2020-12-17 MED FILL — TIMOLOL MALEATE 0.5 % (DAIL: 0.5 | 25 days supply | Qty: 5 | Fill #4

## 2021-01-12 MED FILL — LATANOPROST 0.005% EYE DRP: 0.005 | 30 days supply | Qty: 3 | Fill #11

## 2021-01-12 MED FILL — TIMOLOL MALEATE 0.5 % (DAIL: 0.5 | 25 days supply | Qty: 5 | Fill #5

## 2021-02-11 MED FILL — LATANOPROST 0.005% OPTH SOL: 0.005 | 30 days supply | Qty: 3 | Fill #12

## 2021-02-11 MED FILL — TIMOLOL MALEATE 0.5 % (DAIL: 0.5 | 25 days supply | Qty: 5 | Fill #6

## 2021-03-10 ENCOUNTER — Other Ambulatory Visit (HOSPITAL_COMMUNITY): Payer: Self-pay | Admitting: Ophthalmology

## 2021-03-10 MED FILL — TIMOLOL MALEATE 0.5 % (DAIL: 0.5 | 25 days supply | Qty: 5 | Fill #7

## 2021-03-12 MED FILL — LATANOPROST 0.005% OPTH SOL: 0.005 | 30 days supply | Qty: 3 | Fill #0

## 2021-04-06 ENCOUNTER — Other Ambulatory Visit (HOSPITAL_COMMUNITY): Payer: Self-pay

## 2021-04-06 MED FILL — Timolol Maleate Ophth Soln 0.5% (Once-Daily): OPHTHALMIC | 30 days supply | Qty: 5 | Fill #0 | Status: AC

## 2021-04-06 MED FILL — Latanoprost Ophth Soln 0.005%: OPHTHALMIC | 25 days supply | Qty: 2.5 | Fill #0 | Status: AC

## 2021-04-07 ENCOUNTER — Other Ambulatory Visit (HOSPITAL_COMMUNITY): Payer: Self-pay

## 2021-04-08 ENCOUNTER — Other Ambulatory Visit (HOSPITAL_COMMUNITY): Payer: Self-pay

## 2021-04-30 ENCOUNTER — Other Ambulatory Visit (HOSPITAL_COMMUNITY): Payer: Self-pay

## 2021-04-30 MED FILL — Timolol Maleate Ophth Soln 0.5% (Once-Daily): OPHTHALMIC | 30 days supply | Qty: 5 | Fill #1 | Status: AC

## 2021-04-30 MED FILL — Latanoprost Ophth Soln 0.005%: OPHTHALMIC | 30 days supply | Qty: 2.5 | Fill #1 | Status: AC

## 2021-05-03 ENCOUNTER — Other Ambulatory Visit (HOSPITAL_COMMUNITY): Payer: Self-pay

## 2021-05-29 ENCOUNTER — Other Ambulatory Visit: Payer: Self-pay | Admitting: Cardiovascular Disease

## 2021-05-31 ENCOUNTER — Other Ambulatory Visit (HOSPITAL_COMMUNITY): Payer: Self-pay

## 2021-05-31 MED FILL — Timolol Maleate Ophth Soln 0.5% (Once-Daily): OPHTHALMIC | 25 days supply | Qty: 2.5 | Fill #2 | Status: AC

## 2021-05-31 MED FILL — Latanoprost Ophth Soln 0.005%: OPHTHALMIC | 30 days supply | Qty: 2.5 | Fill #2 | Status: AC

## 2021-05-31 MED FILL — Timolol Maleate Ophth Soln 0.5% (Once-Daily): OPHTHALMIC | 30 days supply | Qty: 5 | Fill #2 | Status: CN

## 2021-06-01 ENCOUNTER — Other Ambulatory Visit (HOSPITAL_COMMUNITY): Payer: Self-pay

## 2021-06-17 ENCOUNTER — Other Ambulatory Visit (HOSPITAL_COMMUNITY): Payer: Self-pay

## 2021-06-17 MED FILL — Latanoprost Ophth Soln 0.005%: OPHTHALMIC | 30 days supply | Qty: 2.5 | Fill #3 | Status: AC

## 2021-06-17 MED FILL — Timolol Maleate Ophth Soln 0.5% (Once-Daily): OPHTHALMIC | 25 days supply | Qty: 2.5 | Fill #3 | Status: AC

## 2021-06-18 ENCOUNTER — Other Ambulatory Visit (HOSPITAL_COMMUNITY): Payer: Self-pay

## 2021-06-22 ENCOUNTER — Other Ambulatory Visit (HOSPITAL_COMMUNITY): Payer: Self-pay

## 2021-07-12 ENCOUNTER — Other Ambulatory Visit (HOSPITAL_COMMUNITY): Payer: Self-pay

## 2021-07-12 MED FILL — Timolol Maleate Ophth Soln 0.5% (Once-Daily): OPHTHALMIC | 25 days supply | Qty: 2.5 | Fill #4 | Status: AC

## 2021-07-12 MED FILL — Latanoprost Ophth Soln 0.005%: OPHTHALMIC | 30 days supply | Qty: 2.5 | Fill #4 | Status: AC

## 2021-07-13 ENCOUNTER — Other Ambulatory Visit (HOSPITAL_COMMUNITY): Payer: Self-pay

## 2021-07-14 ENCOUNTER — Other Ambulatory Visit (HOSPITAL_COMMUNITY): Payer: Self-pay

## 2021-07-30 ENCOUNTER — Other Ambulatory Visit (HOSPITAL_COMMUNITY): Payer: Self-pay

## 2021-07-30 MED FILL — Timolol Maleate Ophth Soln 0.5% (Once-Daily): OPHTHALMIC | 25 days supply | Qty: 2.5 | Fill #5 | Status: AC

## 2021-08-02 ENCOUNTER — Other Ambulatory Visit (HOSPITAL_COMMUNITY): Payer: Self-pay

## 2021-08-04 ENCOUNTER — Other Ambulatory Visit: Payer: Self-pay | Admitting: Cardiovascular Disease

## 2021-08-04 NOTE — Telephone Encounter (Signed)
Rx(s) sent to pharmacy electronically.  

## 2021-08-17 ENCOUNTER — Other Ambulatory Visit: Payer: Self-pay

## 2021-08-17 ENCOUNTER — Encounter: Payer: Self-pay | Admitting: Cardiovascular Disease

## 2021-08-17 ENCOUNTER — Ambulatory Visit (INDEPENDENT_AMBULATORY_CARE_PROVIDER_SITE_OTHER): Payer: Commercial Managed Care - PPO | Admitting: Cardiovascular Disease

## 2021-08-17 DIAGNOSIS — I2510 Atherosclerotic heart disease of native coronary artery without angina pectoris: Secondary | ICD-10-CM | POA: Diagnosis not present

## 2021-08-17 DIAGNOSIS — E785 Hyperlipidemia, unspecified: Secondary | ICD-10-CM

## 2021-08-17 DIAGNOSIS — I10 Essential (primary) hypertension: Secondary | ICD-10-CM

## 2021-08-17 NOTE — Patient Instructions (Signed)

## 2021-08-17 NOTE — Progress Notes (Signed)
08/17/2021 Tyler Holder   11-17-1961  [id number]  Primary Physician Deatra James, MD Primary Cardiologist: Runell Gess MD [place], [place]  HPI:  Tyler Holder is a 60 y.o.  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 He was referred by his primary care physician for evaluation of chest pain. I last saw him in the office  06/10/2020. He has  had intervention by Dr. Eldridge Dace on the dominant RCA using a drug-eluting stent (3.5 mm x 18 mm). 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 His cardiac risk factors include hypertension and family history (father died of myocardial infarction in his early 65s). Because of recurrent symptoms and a Myoview that showed inferolateral ischemia I performed chronic catheterization on him 03/30/15 revealing unchanged anatomy. I thought it was possible that his circumflex chronic total occlusion was continuing to his chest pain as well as his Myoview abnormality and therefore referred him back to Dr. Eldridge Dace who performed a complex CTO intervention 06/03/15 using both antegrade and retrograde approach. He performed circumflex AV groove and obtuse marginal branch bifurcation stenting. His symptoms have since resolved.    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  He was working out fairly frequently until COVID-19, but has not worked out for the last year.    Since I saw him a year ago he remains asymptomatic.  Denies chest pain or shortness of breath.   Current Meds  Medication Sig   acetaminophen (TYLENOL) 500 MG tablet Take 500 mg by mouth every 6 (six) hours as needed (pain).   amLODipine (NORVASC) 5 MG tablet TAKE ONE TABLET BY MOUTH ONE TIME DAILY   aspirin EC 81 MG tablet Take 81 mg by mouth daily after supper.    atorvastatin (LIPITOR) 80 MG tablet TAKE ONE TABLET BY MOUTH ONE TIME DAILY   clopidogrel (PLAVIX) 75 MG tablet Take 1 tablet (75 mg total) by mouth daily.   Coenzyme Q-10 100 MG capsule Take 2 capsules (200 mg total) by mouth daily.   COMBIGAN 0.2-0.5 % ophthalmic solution Place 1 drop into both eyes as directed.   irbesartan (AVAPRO) 150 MG tablet TAKE ONE TABLET BY MOUTH ONE TIME DAILY   latanoprost (XALATAN) 0.005 % ophthalmic solution INSTILL 1 DROP INTO THE AFFECTED EYES ONCE DAILY IN THE EVENING   nitroGLYCERIN (NITROSTAT) 0.4 MG SL tablet Place 1 tablet (0.4 mg total) under the tongue every 5 (five) minutes x 3 doses as needed for chest pain.   Timolol Maleate, Once-Daily, 0.5 % SOLN INSTILL 1 DROP INTO BOTH EYES TWICE DAILY   TRAVATAN Z 0.004 % SOLN ophthalmic solution Place 1 drop into both eyes as directed.     Allergies  Allergen Reactions   Imdur [Isosorbide Nitrate] Other (See Comments)    Headache   Lisinopril Cough    Social History   Socioeconomic History   Marital status: Married    Spouse name: Not on file   Number of children: Not on file   Years of education: Not on file   Highest education level: Not on file  Occupational History   Not on file  Tobacco Use   Smoking status: Never   Smokeless tobacco: Never  Substance and Sexual Activity  Alcohol use: No    Alcohol/week: 0.0 standard drinks   Drug use: No   Sexual activity: Yes  Other Topics Concern   Not on file  Social History Narrative   Not on file   Social Determinants of Health   Financial Resource Strain: Not on file  Food Insecurity: Not on file  Transportation Needs: Not on file  Physical Activity: Not on file  Stress: Not on file  Social Connections: Not on file  Intimate Partner Violence: Not on file     Review of Systems: General: negative for chills, fever, night sweats or weight changes.  Cardiovascular: negative for chest pain, dyspnea on exertion, edema, orthopnea,  palpitations, paroxysmal nocturnal dyspnea or shortness of breath Dermatological: negative for rash Respiratory: negative for cough or wheezing Urologic: negative for hematuria Abdominal: negative for nausea, vomiting, diarrhea, bright red blood per rectum, melena, or hematemesis Neurologic: negative for visual changes, syncope, or dizziness All other systems reviewed and are otherwise negative except as noted above.    Blood pressure 122/70, pulse 71, height 5\' 10"  (1.778 m), weight 202 lb 12.8 oz (92 kg), SpO2 98 %.  General appearance: alert and no distress Neck: no adenopathy, no carotid bruit, no JVD, supple, symmetrical, trachea midline, and thyroid not enlarged, symmetric, no tenderness/mass/nodules Lungs: clear to auscultation bilaterally Heart: regular rate and rhythm, S1, S2 normal, no murmur, click, rub or gallop Extremities: extremities normal, atraumatic, no cyanosis or edema Pulses: 2+ and symmetric Skin: Skin color, texture, turgor normal. No rashes or lesions Neurologic: Grossly normal  EKG sinus rhythm at 71 with left axis deviation.  I personally reviewed this EKG.  ASSESSMENT AND PLAN:   Hyperlipidemia LDL goal <70 History of hyperlipidemia on high-dose statin therapy lipid profile performed 06/25/2020 revealing total cholesterol 116, LDL 64 HDL 20.  Essential hypertension History of essential hypertension a blood pressure measured today 122/70.  She is on amlodipine and Avapro.  CAD, multiple vessel, culprit RCA -prox with DES placed, total LCX-old, mod LAD- FFR not Hemodynamically signficant 06/06/14  History of CAD status post RCA stenting by Dr. 06/08/14 with a 3.5 mm x 18 mm long drug-eluting stent.  At the time of the original cath he was found to have an occluded nondominant circumflex with moderate LAD disease.  Because of recurrent symptoms a Myoview was performed that showed inferolateral ischemia.  I performed diagnostic coronary angiography/11/16 revealing  unchanged anatomy.  I thought that his symptoms may be related related to his circumflex CTO I therefore referred him back to Dr. Eldridge Dace who performed complex CTO intervention using both antegrade and retrograde approach with circumflex and obtuse marginal branch bifurcation stenting.  His symptoms resolved.  He remains on dual antiplatelet therapy.     Eldridge Dace MD FACP,FACC,FAHA, [facility] 08/17/2021 3:05 PM

## 2021-08-17 NOTE — Assessment & Plan Note (Signed)
History of essential hypertension a blood pressure measured today 122/70.  She is on amlodipine and Avapro.

## 2021-08-17 NOTE — Assessment & Plan Note (Signed)
History of CAD status post RCA stenting by Dr. Eldridge Dace with a 3.5 mm x 18 mm long drug-eluting stent.  At the time of the original cath he was found to have an occluded nondominant circumflex with moderate LAD disease.  Because of recurrent symptoms a Myoview was performed that showed inferolateral ischemia.  I performed diagnostic coronary angiography/11/16 revealing unchanged anatomy.  I thought that his symptoms may be related related to his circumflex CTO I therefore referred him back to Dr. Eldridge Dace who performed complex CTO intervention using both antegrade and retrograde approach with circumflex and obtuse marginal branch bifurcation stenting.  His symptoms resolved.  He remains on dual antiplatelet therapy.

## 2021-08-17 NOTE — Assessment & Plan Note (Signed)
History of hyperlipidemia on high-dose statin therapy lipid profile performed 06/25/2020 revealing total cholesterol 116, LDL 64 HDL 20.

## 2021-08-19 ENCOUNTER — Other Ambulatory Visit (HOSPITAL_COMMUNITY): Payer: Self-pay

## 2021-08-19 MED FILL — Timolol Maleate Ophth Soln 0.5% (Once-Daily): OPHTHALMIC | 25 days supply | Qty: 2.5 | Fill #6 | Status: AC

## 2021-08-20 ENCOUNTER — Other Ambulatory Visit (HOSPITAL_COMMUNITY): Payer: Self-pay

## 2021-09-07 ENCOUNTER — Other Ambulatory Visit (HOSPITAL_COMMUNITY): Payer: Self-pay

## 2021-09-07 MED FILL — Latanoprost Ophth Soln 0.005%: OPHTHALMIC | 30 days supply | Qty: 2.5 | Fill #5 | Status: AC

## 2021-09-08 ENCOUNTER — Other Ambulatory Visit (HOSPITAL_COMMUNITY): Payer: Self-pay

## 2021-09-09 ENCOUNTER — Other Ambulatory Visit (HOSPITAL_COMMUNITY): Payer: Self-pay

## 2021-09-09 MED ORDER — TIMOLOL MALEATE (ONCE-DAILY) 0.5 % OP SOLN
OPHTHALMIC | 11 refills | Status: AC
  Filled 2021-09-09: qty 5, 25d supply, fill #0
  Filled 2021-11-26: qty 5, 25d supply, fill #1
  Filled 2022-08-12: qty 5, 25d supply, fill #2

## 2021-09-10 ENCOUNTER — Other Ambulatory Visit (HOSPITAL_COMMUNITY): Payer: Self-pay

## 2021-09-29 ENCOUNTER — Other Ambulatory Visit (HOSPITAL_COMMUNITY): Payer: Self-pay

## 2021-09-29 MED ORDER — LATANOPROST 0.005 % OP SOLN
OPHTHALMIC | 4 refills | Status: DC
  Filled 2021-09-29: qty 2.5, 30d supply, fill #0
  Filled 2021-11-01: qty 2.5, 30d supply, fill #1
  Filled 2021-11-26: qty 2.5, 30d supply, fill #2
  Filled 2022-01-20: qty 2.5, 30d supply, fill #3
  Filled 2022-03-04: qty 2.5, 30d supply, fill #4
  Filled 2022-03-30: qty 2.5, 30d supply, fill #5
  Filled 2022-04-21: qty 2.5, 30d supply, fill #6
  Filled 2022-05-10 – 2022-06-16 (×2): qty 2.5, 30d supply, fill #7
  Filled 2022-07-15: qty 2.5, 30d supply, fill #8
  Filled 2022-09-12: qty 2.5, 30d supply, fill #9

## 2021-09-29 MED ORDER — TIMOLOL MALEATE (ONCE-DAILY) 0.5 % OP SOLN
OPHTHALMIC | 4 refills | Status: DC
  Filled 2021-09-29: qty 5, 20d supply, fill #0
  Filled 2021-11-01: qty 5, 20d supply, fill #1
  Filled 2021-12-23: qty 5, 20d supply, fill #2
  Filled 2022-01-20: qty 5, 20d supply, fill #3
  Filled 2022-02-09: qty 5, 20d supply, fill #4
  Filled 2022-03-04: qty 5, 20d supply, fill #5
  Filled 2022-03-30: qty 5, 20d supply, fill #6
  Filled 2022-04-21: qty 5, 20d supply, fill #7
  Filled 2022-05-10: qty 5, 20d supply, fill #8
  Filled 2022-06-16: qty 5, 20d supply, fill #9
  Filled 2022-07-15: qty 5, 20d supply, fill #10
  Filled 2022-09-12: qty 5, 20d supply, fill #11

## 2021-09-30 ENCOUNTER — Other Ambulatory Visit (HOSPITAL_COMMUNITY): Payer: Self-pay

## 2021-10-04 ENCOUNTER — Other Ambulatory Visit (HOSPITAL_COMMUNITY): Payer: Self-pay

## 2021-10-04 ENCOUNTER — Other Ambulatory Visit: Payer: Self-pay | Admitting: Cardiovascular Disease

## 2021-10-11 ENCOUNTER — Other Ambulatory Visit: Payer: Self-pay | Admitting: Cardiovascular Disease

## 2021-11-01 ENCOUNTER — Other Ambulatory Visit (HOSPITAL_COMMUNITY): Payer: Self-pay

## 2021-11-02 ENCOUNTER — Other Ambulatory Visit (HOSPITAL_COMMUNITY): Payer: Self-pay

## 2021-11-26 ENCOUNTER — Other Ambulatory Visit (HOSPITAL_COMMUNITY): Payer: Self-pay

## 2021-11-29 ENCOUNTER — Other Ambulatory Visit (HOSPITAL_COMMUNITY): Payer: Self-pay

## 2021-12-01 ENCOUNTER — Other Ambulatory Visit: Payer: Self-pay | Admitting: Cardiovascular Disease

## 2021-12-23 ENCOUNTER — Other Ambulatory Visit (HOSPITAL_COMMUNITY): Payer: Self-pay

## 2021-12-24 ENCOUNTER — Other Ambulatory Visit (HOSPITAL_COMMUNITY): Payer: Self-pay

## 2022-01-20 ENCOUNTER — Other Ambulatory Visit (HOSPITAL_COMMUNITY): Payer: Self-pay

## 2022-01-21 ENCOUNTER — Other Ambulatory Visit (HOSPITAL_COMMUNITY): Payer: Self-pay

## 2022-02-04 ENCOUNTER — Other Ambulatory Visit (HOSPITAL_COMMUNITY): Payer: Self-pay

## 2022-02-09 ENCOUNTER — Other Ambulatory Visit (HOSPITAL_COMMUNITY): Payer: Self-pay

## 2022-02-10 ENCOUNTER — Other Ambulatory Visit (HOSPITAL_COMMUNITY): Payer: Self-pay

## 2022-02-27 ENCOUNTER — Other Ambulatory Visit: Payer: Self-pay | Admitting: Cardiovascular Disease

## 2022-03-04 ENCOUNTER — Other Ambulatory Visit (HOSPITAL_COMMUNITY): Payer: Self-pay

## 2022-03-07 ENCOUNTER — Other Ambulatory Visit (HOSPITAL_COMMUNITY): Payer: Self-pay

## 2022-03-30 ENCOUNTER — Other Ambulatory Visit (HOSPITAL_COMMUNITY): Payer: Self-pay

## 2022-03-31 ENCOUNTER — Other Ambulatory Visit (HOSPITAL_COMMUNITY): Payer: Self-pay

## 2022-04-21 ENCOUNTER — Other Ambulatory Visit (HOSPITAL_COMMUNITY): Payer: Self-pay

## 2022-04-25 ENCOUNTER — Other Ambulatory Visit (HOSPITAL_COMMUNITY): Payer: Self-pay

## 2022-05-10 ENCOUNTER — Other Ambulatory Visit (HOSPITAL_COMMUNITY): Payer: Self-pay

## 2022-05-11 ENCOUNTER — Other Ambulatory Visit (HOSPITAL_COMMUNITY): Payer: Self-pay

## 2022-06-03 ENCOUNTER — Other Ambulatory Visit: Payer: Self-pay | Admitting: Cardiovascular Disease

## 2022-06-16 ENCOUNTER — Other Ambulatory Visit (HOSPITAL_COMMUNITY): Payer: Self-pay

## 2022-06-17 ENCOUNTER — Other Ambulatory Visit (HOSPITAL_COMMUNITY): Payer: Self-pay

## 2022-06-20 ENCOUNTER — Other Ambulatory Visit (HOSPITAL_COMMUNITY): Payer: Self-pay

## 2022-07-15 ENCOUNTER — Other Ambulatory Visit (HOSPITAL_COMMUNITY): Payer: Self-pay

## 2022-07-18 ENCOUNTER — Other Ambulatory Visit (HOSPITAL_COMMUNITY): Payer: Self-pay

## 2022-08-12 ENCOUNTER — Other Ambulatory Visit (HOSPITAL_COMMUNITY): Payer: Self-pay

## 2022-08-15 ENCOUNTER — Other Ambulatory Visit (HOSPITAL_COMMUNITY): Payer: Self-pay

## 2022-08-17 ENCOUNTER — Ambulatory Visit: Payer: Commercial Managed Care - PPO | Attending: Cardiovascular Disease | Admitting: Cardiovascular Disease

## 2022-08-17 ENCOUNTER — Encounter: Payer: Self-pay | Admitting: Cardiovascular Disease

## 2022-08-17 DIAGNOSIS — E785 Hyperlipidemia, unspecified: Secondary | ICD-10-CM

## 2022-08-17 DIAGNOSIS — I10 Essential (primary) hypertension: Secondary | ICD-10-CM

## 2022-08-17 DIAGNOSIS — I2510 Atherosclerotic heart disease of native coronary artery without angina pectoris: Secondary | ICD-10-CM | POA: Diagnosis not present

## 2022-08-17 NOTE — Assessment & Plan Note (Signed)
History of hyperlipidemia on statin therapy.  We will recheck a lipid liver profile 

## 2022-08-17 NOTE — Progress Notes (Signed)
08/17/2022 Tyler Holder   11/03/1961  [id number]  Primary Physician Tyler James, MD Primary Cardiologist: Tyler Gess MD Tyler Holder, [place]  HPI:  Tyler Holder is a 61 y.o.  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 He was referred by his primary care physician for evaluation of chest pain. I last saw him in the office 08/17/2021.Tyler Holder  He is accompanied by his wife Tyler Holder today.  He has  had intervention by Dr. Eldridge Dace on the dominant RCA using a drug-eluting stent (3.5 mm x 18 mm). 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 His cardiac risk factors include hypertension and family history (father died of myocardial infarction in his early 90s). Because of recurrent symptoms and a Myoview that showed inferolateral ischemia I performed chronic catheterization on him 03/30/15 revealing unchanged anatomy. I thought it was possible that his circumflex chronic total occlusion was continuing to his chest pain as well as his Myoview abnormality and therefore referred him back to Dr. Eldridge Dace who performed a complex CTO intervention 06/03/15 using both antegrade and retrograde approach. He performed circumflex AV groove and obtuse marginal branch bifurcation stenting. His symptoms have since resolved.    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  He was working out fairly frequently until COVID-19, but has not worked out for the last year.    Since I saw him a year ago he remains asymptomatic.  Denies chest pain or shortness of breath.   Current Meds  Medication Sig   acetaminophen (TYLENOL) 500 MG tablet Take 500 mg by mouth every 6 (six) hours as needed (pain).   amLODipine (NORVASC) 5 MG tablet TAKE ONE TABLET BY MOUTH ONE TIME DAILY   aspirin EC 81 MG  tablet Take 81 mg by mouth daily after supper.   atorvastatin (LIPITOR) 80 MG tablet TAKE ONE TABLET BY MOUTH ONE TIME DAILY   clopidogrel (PLAVIX) 75 MG tablet TAKE ONE TABLET BY MOUTH ONE TIME DAILY   Coenzyme Q-10 100 MG capsule Take 2 capsules (200 mg total) by mouth daily.   COMBIGAN 0.2-0.5 % ophthalmic solution Place 1 drop into both eyes as directed.   irbesartan (AVAPRO) 150 MG tablet TAKE ONE TABLET BY MOUTH ONE TIME DAILY   latanoprost (XALATAN) 0.005 % ophthalmic solution Place 1 drop into both eyes at bedtime   nitroGLYCERIN (NITROSTAT) 0.4 MG SL tablet Place 1 tablet (0.4 mg total) under the tongue every 5 (five) minutes x 3 doses as needed for chest pain.   Timolol Maleate, Once-Daily, 0.5 % SOLN Place 1 drop into both eyes 2 (two) times daily   Timolol Maleate, Once-Daily, 0.5 % SOLN Place 1 drop into both eyes 2 (two) times daily   [DISCONTINUED] TRAVATAN Z 0.004 % SOLN ophthalmic solution Place 1 drop into both eyes as directed.     Allergies  Allergen Reactions   Imdur [Isosorbide Nitrate] Other (See Comments)    Headache   Lisinopril Cough    Social History   Socioeconomic History   Marital status: Married    Spouse name: Not on file   Number of children: Not on file   Years of education: Not on file   Highest education level: Not on file  Occupational  History   Not on file  Tobacco Use   Smoking status: Never   Smokeless tobacco: Never  Substance and Sexual Activity   Alcohol use: No    Alcohol/week: 0.0 standard drinks of alcohol   Drug use: No   Sexual activity: Yes  Other Topics Concern   Not on file  Social History Narrative   Not on file   Social Determinants of Health   Financial Resource Strain: Not on file  Food Insecurity: Not on file  Transportation Needs: Not on file  Physical Activity: Not on file  Stress: Not on file  Social Connections: Not on file  Intimate Partner Violence: Not on file     Review of Systems: General: negative  for chills, fever, night sweats or weight changes.  Cardiovascular: negative for chest pain, dyspnea on exertion, edema, orthopnea, palpitations, paroxysmal nocturnal dyspnea or shortness of breath Dermatological: negative for rash Respiratory: negative for cough or wheezing Urologic: negative for hematuria Abdominal: negative for nausea, vomiting, diarrhea, bright red blood per rectum, melena, or hematemesis Neurologic: negative for visual changes, syncope, or dizziness All other systems reviewed and are otherwise negative except as noted above.    Blood pressure 104/60, pulse 60, height 5\' 10"  (1.778 m), weight 203 lb (92.1 kg).  General appearance: alert and no distress Neck: no adenopathy, no carotid bruit, no JVD, supple, symmetrical, trachea midline, and thyroid not enlarged, symmetric, no tenderness/mass/nodules Lungs: clear to auscultation bilaterally Heart: regular rate and rhythm, S1, S2 normal, no murmur, click, rub or gallop Extremities: extremities normal, atraumatic, no cyanosis or edema Pulses: 2+ and symmetric Skin: Skin color, texture, turgor normal. No rashes or lesions Neurologic: Grossly normal  EKG sinus rhythm at 60 without ST or T wave changes.  I personally reviewed this EKG.  ASSESSMENT AND PLAN:   Hyperlipidemia LDL goal <70 History of hyperlipidemia on statin therapy.  We will recheck a lipid liver profile.  Essential hypertension History of essential hypertension blood pressure measured today at 104/60.  He is on amlodipine, and Avapro.  CAD, multiple vessel, culprit RCA -prox with DES placed, total LCX-old, mod LAD- FFR not Hemodynamically signficant 06/06/14  History of CAD status post RCA intervention by Dr. 06/08/14 of a dominant RCA using a 3.5 x 18 mm long drug-eluting stent.  He did have an occluded circumflex at that time.  Because of recurrent symptoms he had a Myoview stress test that showed inferolateral ischemia.  I performed catheterization on  him/11/16 revealing unchanged anatomy.  I thought his circumflex CTO potentially was contributing and because of this I referred him to Drs. [id number] and [place] who performed antegrade/retrograde CTO recanalization of his occluded circumflex with bifurcation stenting.  His symptoms resolved after that.     [place] MD FACP,FACC,FAHA, [facility] 08/17/2022 2:53 PM

## 2022-08-17 NOTE — Assessment & Plan Note (Addendum)
History of CAD status post RCA intervention by Dr. Eldridge Dace of a dominant RCA using a 3.5 x 18 mm long drug-eluting stent.  He did have an occluded circumflex at that time.  Because of recurrent symptoms he had a Myoview stress test that showed inferolateral ischemia.  I performed catheterization on him/11/16 revealing unchanged anatomy.  I thought his circumflex CTO potentially was contributing and because of this I referred him to Drs. [place] and [place] who performed antegrade/retrograde CTO recanalization of his occluded circumflex with bifurcation stenting.  His symptoms resolved after that.

## 2022-08-17 NOTE — Assessment & Plan Note (Signed)
History of essential hypertension blood pressure measured today at 104/60.  He is on amlodipine, and Avapro.

## 2022-08-17 NOTE — Patient Instructions (Signed)
Medication Instructions:  Your physician recommends that you continue on your current medications as directed. Please refer to the Current Medication list given to you today.  *If you need a refill on your cardiac medications before your next appointment, please call your pharmacy*   Lab Work: Your physician recommends that you return for lab work in: next week or 2 for FASTING lipid/liver panel  If you have labs (blood work) drawn today and your tests are completely normal, you will receive your results only by: MyChart Message (if you have MyChart) OR A paper copy in the mail If you have any lab test that is abnormal or we need to change your treatment, we will call you to review the results.   Follow-Up: At [facility], you and your health needs are our priority.  As part of our continuing mission to provide you with exceptional heart care, we have created designated Provider Care Teams.  These Care Teams include your primary Cardiologist (physician) and Advanced Practice Providers (APPs -  Physician Assistants and Nurse Practitioners) who all work together to provide you with the care you need, when you need it.  We recommend signing up for the patient portal called "MyChart".  Sign up information is provided on this After Visit Summary.  MyChart is used to connect with patients for Virtual Visits (Telemedicine).  Patients are able to view lab/test results, encounter notes, upcoming appointments, etc.  Non-urgent messages can be sent to your provider as well.   To learn more about what you can do with MyChart, go to [email or web address].    Your next appointment:   12 month(s)  The format for your next appointment:   In Person  Provider:   Jonathan Berry, MD    

## 2022-09-02 LAB — LIPID PANEL
Chol/HDL Ratio: 4.5 ratio (ref 0.0–5.0)
Cholesterol, Total: 112 mg/dL (ref 100–199)
HDL: 25 mg/dL — ABNORMAL LOW (ref >39)
LDL Chol Calc (NIH): 57 mg/dL (ref 0–99)
Triglycerides: 177 mg/dL — ABNORMAL HIGH (ref 0–149)
VLDL Cholesterol Cal: 30 mg/dL (ref 5–40)

## 2022-09-02 LAB — HEPATIC FUNCTION PANEL
ALT: 30 IU/L (ref 0–44)
AST: 18 IU/L (ref 0–40)
Albumin: 4.3 g/dL (ref 3.9–4.9)
Alkaline Phosphatase: 64 IU/L (ref 44–121)
Bilirubin Total: 0.8 mg/dL (ref 0.0–1.2)
Bilirubin, Direct: 0.23 mg/dL (ref 0.00–0.40)
Total Protein: 6.5 g/dL (ref 6.0–8.5)

## 2022-09-12 ENCOUNTER — Other Ambulatory Visit (HOSPITAL_COMMUNITY): Payer: Self-pay

## 2022-09-13 ENCOUNTER — Other Ambulatory Visit (HOSPITAL_COMMUNITY): Payer: Self-pay

## 2022-10-04 ENCOUNTER — Other Ambulatory Visit: Payer: Self-pay | Admitting: Cardiovascular Disease

## 2022-10-07 ENCOUNTER — Other Ambulatory Visit (HOSPITAL_COMMUNITY): Payer: Self-pay

## 2022-10-10 ENCOUNTER — Other Ambulatory Visit (HOSPITAL_COMMUNITY): Payer: Self-pay

## 2022-10-10 MED ORDER — TIMOLOL MALEATE (ONCE-DAILY) 0.5 % OP SOLN
1.0000 [drp] | Freq: Two times a day (BID) | OPHTHALMIC | 4 refills | Status: DC
Start: 2022-10-10 — End: 2024-08-12
  Filled 2022-10-10: qty 5, 25d supply, fill #0
  Filled 2022-11-02: qty 15, 75d supply, fill #0
  Filled 2022-11-04: qty 5, 25d supply, fill #0
  Filled 2022-12-05: qty 5, 25d supply, fill #1
  Filled 2023-01-03: qty 5, 25d supply, fill #2
  Filled 2023-02-03: qty 5, 25d supply, fill #3
  Filled 2023-03-02: qty 5, 25d supply, fill #4
  Filled 2023-03-30: qty 5, 25d supply, fill #5
  Filled 2023-04-27: qty 5, 25d supply, fill #6
  Filled 2023-05-26: qty 5, 25d supply, fill #7

## 2022-10-10 MED ORDER — LATANOPROST 0.005 % OP SOLN
1.0000 [drp] | Freq: Every evening | OPHTHALMIC | 4 refills | Status: AC
  Filled 2022-10-10: qty 2.5, 30d supply, fill #0
  Filled 2023-01-03: qty 2.5, 30d supply, fill #1

## 2022-10-10 MED ORDER — TIMOLOL MALEATE (ONCE-DAILY) 0.5 % OP SOLN
1.0000 [drp] | Freq: Two times a day (BID) | OPHTHALMIC | 4 refills | Status: DC
  Filled 2022-10-10: qty 5, 25d supply, fill #0

## 2022-10-10 MED ORDER — LATANOPROST 0.005 % OP SOLN
1.0000 [drp] | Freq: Every day | OPHTHALMIC | 4 refills | Status: DC
  Filled 2022-10-10: qty 2.5, 25d supply, fill #0
  Filled 2022-11-02 – 2022-11-03 (×2): qty 2.5, 30d supply, fill #0
  Filled 2022-12-05: qty 2.5, 30d supply, fill #1
  Filled 2023-02-03: qty 2.5, 30d supply, fill #2
  Filled 2023-03-02 – 2023-03-03 (×3): qty 2.5, 30d supply, fill #3
  Filled 2023-03-30 – 2023-03-31 (×3): qty 2.5, 30d supply, fill #4
  Filled 2023-04-27: qty 2.5, 30d supply, fill #5
  Filled 2023-05-26: qty 2.5, 30d supply, fill #6
  Filled 2023-07-17: qty 2.5, 30d supply, fill #7
  Filled 2023-08-18: qty 2.5, 30d supply, fill #8
  Filled 2023-09-25: qty 2.5, 30d supply, fill #9

## 2022-10-11 ENCOUNTER — Other Ambulatory Visit (HOSPITAL_COMMUNITY): Payer: Self-pay

## 2022-10-14 ENCOUNTER — Other Ambulatory Visit: Payer: Self-pay | Admitting: Cardiovascular Disease

## 2022-10-14 NOTE — Telephone Encounter (Signed)
*  STAT* If patient is at the pharmacy, call can be transferred to refill team.   1. Which medications need to be refilled? (please list name of each medication and dose if known) atorvastatin (LIPITOR) 80 MG tablet  2. Which pharmacy/location (including street and city if local pharmacy) is medication to be sent to?[facility] [address] [place], [place]. AT [place]  3. Do they need a 30 day or 90 day supply? 90 day

## 2022-11-02 ENCOUNTER — Other Ambulatory Visit (HOSPITAL_COMMUNITY): Payer: Self-pay

## 2022-11-03 ENCOUNTER — Other Ambulatory Visit (HOSPITAL_COMMUNITY): Payer: Self-pay

## 2022-11-04 ENCOUNTER — Other Ambulatory Visit (HOSPITAL_COMMUNITY): Payer: Self-pay

## 2022-11-05 ENCOUNTER — Other Ambulatory Visit (HOSPITAL_COMMUNITY): Payer: Self-pay

## 2022-11-07 ENCOUNTER — Other Ambulatory Visit (HOSPITAL_COMMUNITY): Payer: Self-pay

## 2022-11-08 ENCOUNTER — Other Ambulatory Visit (HOSPITAL_COMMUNITY): Payer: Self-pay

## 2022-12-05 ENCOUNTER — Other Ambulatory Visit (HOSPITAL_COMMUNITY): Payer: Self-pay

## 2023-01-03 ENCOUNTER — Other Ambulatory Visit (HOSPITAL_COMMUNITY): Payer: Self-pay

## 2023-01-06 ENCOUNTER — Telehealth: Payer: Self-pay | Admitting: Cardiovascular Disease

## 2023-01-06 ENCOUNTER — Other Ambulatory Visit (HOSPITAL_COMMUNITY): Payer: Self-pay

## 2023-01-06 NOTE — Telephone Encounter (Signed)
Called patient, he states he gets heartburn when he eats spicy foods. He states he would like to know what could be taken, we discussed TUMS, and other antacids, but if things did not improve he should see a primary care provider.   Patient verbalized understanding.

## 2023-01-06 NOTE — Telephone Encounter (Signed)
Patient is returning call.

## 2023-01-06 NOTE — Telephone Encounter (Signed)
Please return call to [phone].

## 2023-01-06 NOTE — Telephone Encounter (Signed)
Attempted to call patient, left message for patient to call back to office.   

## 2023-01-06 NOTE — Telephone Encounter (Signed)
Patient is calling wanting recommendations on what medication he should take when he eats spicy food. Please advise.

## 2023-02-03 ENCOUNTER — Other Ambulatory Visit (HOSPITAL_COMMUNITY): Payer: Self-pay

## 2023-03-02 ENCOUNTER — Other Ambulatory Visit (HOSPITAL_COMMUNITY): Payer: Self-pay

## 2023-03-03 ENCOUNTER — Other Ambulatory Visit (HOSPITAL_COMMUNITY): Payer: Self-pay

## 2023-03-30 ENCOUNTER — Other Ambulatory Visit (HOSPITAL_COMMUNITY): Payer: Self-pay

## 2023-03-31 ENCOUNTER — Other Ambulatory Visit (HOSPITAL_COMMUNITY): Payer: Self-pay

## 2023-04-27 ENCOUNTER — Other Ambulatory Visit (HOSPITAL_COMMUNITY): Payer: Self-pay

## 2023-05-26 ENCOUNTER — Other Ambulatory Visit (HOSPITAL_COMMUNITY): Payer: Self-pay

## 2023-05-27 ENCOUNTER — Other Ambulatory Visit (HOSPITAL_COMMUNITY): Payer: Self-pay

## 2023-05-31 ENCOUNTER — Other Ambulatory Visit: Payer: Self-pay

## 2023-05-31 ENCOUNTER — Other Ambulatory Visit: Payer: Self-pay | Admitting: Cardiovascular Disease

## 2023-05-31 MED ORDER — CLOPIDOGREL BISULFATE 75 MG PO TABS: 75.0000 mg | ORAL_TABLET | Freq: Every day | ORAL | 0 refills | Status: DC

## 2023-05-31 NOTE — Telephone Encounter (Signed)
Pt's medication was sent to pt's pharmacy as requested. Confirmation received.  °

## 2023-06-12 ENCOUNTER — Other Ambulatory Visit (HOSPITAL_COMMUNITY): Payer: Self-pay

## 2023-06-12 MED ORDER — DORZOLAMIDE HCL-TIMOLOL MAL 2-0.5 % OP SOLN
1.0000 [drp] | Freq: Two times a day (BID) | OPHTHALMIC | 11 refills | Status: AC
  Filled 2023-06-12: qty 10, 30d supply, fill #0
  Filled 2023-07-17: qty 10, 30d supply, fill #1
  Filled 2023-08-18: qty 10, 30d supply, fill #2
  Filled 2023-09-25: qty 10, 30d supply, fill #3
  Filled 2023-10-30: qty 10, 30d supply, fill #4
  Filled 2023-11-30: qty 10, 30d supply, fill #5
  Filled 2023-12-29: qty 10, 30d supply, fill #6
  Filled 2024-02-19: qty 10, 30d supply, fill #7

## 2023-06-14 ENCOUNTER — Telehealth: Payer: Self-pay | Admitting: Cardiovascular Disease

## 2023-06-14 NOTE — Telephone Encounter (Signed)
Patient wanted to know if CBD gummies have an interaction with any of his heart medication.

## 2023-06-14 NOTE — Telephone Encounter (Signed)
Patient aware that CBD gummies will decrease effectiveness of plavix. He verbalized understanding and stated he will not take it.

## 2023-06-14 NOTE — Telephone Encounter (Signed)
Pt would like a callback regarding whether or not he can take some CBD gummies for arthritis pain in legs and hips. Please advise.

## 2023-06-14 NOTE — Telephone Encounter (Signed)
Patient was returning a call. Please advise  

## 2023-06-14 NOTE — Telephone Encounter (Signed)
CBD gummies can decrease the effectiveness of his plavix

## 2023-06-14 NOTE — Telephone Encounter (Signed)
Left voicemail for patient to return call to office. 

## 2023-07-17 ENCOUNTER — Other Ambulatory Visit (HOSPITAL_COMMUNITY): Payer: Self-pay

## 2023-08-16 ENCOUNTER — Ambulatory Visit: Payer: Commercial Managed Care - PPO | Attending: Cardiovascular Disease | Admitting: Cardiovascular Disease

## 2023-08-16 ENCOUNTER — Encounter: Payer: Self-pay | Admitting: Cardiovascular Disease

## 2023-08-16 VITALS — BP 122/80 | HR 57 | Ht 70.0 in | Wt 209.8 lb

## 2023-08-16 DIAGNOSIS — E785 Hyperlipidemia, unspecified: Secondary | ICD-10-CM

## 2023-08-16 DIAGNOSIS — I25110 Atherosclerotic heart disease of native coronary artery with unstable angina pectoris: Secondary | ICD-10-CM

## 2023-08-16 DIAGNOSIS — I2510 Atherosclerotic heart disease of native coronary artery without angina pectoris: Secondary | ICD-10-CM

## 2023-08-16 DIAGNOSIS — I10 Essential (primary) hypertension: Secondary | ICD-10-CM

## 2023-08-16 DIAGNOSIS — I200 Unstable angina: Secondary | ICD-10-CM

## 2023-08-16 NOTE — Assessment & Plan Note (Signed)
History of essential hypertension with blood pressure measured today at 122/80.  He is on amlodipine, and Avapro.

## 2023-08-16 NOTE — Progress Notes (Signed)
08/16/2023 Tyler Holder   05/05/1961  [id number]  Primary Physician Patient, No Pcp Per Primary Cardiologist: Runell Gess MD Roseanne Reno  HPI:  Tyler Holder is a 62 y.o.   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 He was referred by his primary care physician for evaluation of chest pain. I last saw him in the office 08/17/2022.Marland KitchenHe has  had intervention by Dr. Eldridge Dace on the dominant RCA using a drug-eluting stent (3.5 mm x 18 mm). 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 His cardiac risk factors include hypertension and family history (father died of myocardial infarction in his early 62s). Because of recurrent symptoms and a Myoview that showed inferolateral ischemia I performed chronic catheterization on him 03/30/15 revealing unchanged anatomy. I thought it was possible that his circumflex chronic total occlusion was continuing to his chest pain as well as his Myoview abnormality and therefore referred him back to Dr. Eldridge Dace who performed a complex CTO intervention 06/03/15 using both antegrade and retrograde approach. He performed circumflex AV groove and obtuse marginal branch bifurcation stenting. His symptoms have since resolved.    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     Since I saw him a year ago he remains asymptomatic.  Denies chest pain or shortness of breath.  He has gained several pounds however.   Current Meds  Medication Sig   acetaminophen (TYLENOL) 500 MG tablet Take 500 mg by mouth every 6 (six) hours as needed (pain).   amLODipine (NORVASC) 5 MG tablet TAKE ONE TABLET BY MOUTH ONE TIME DAILY   aspirin EC 81 MG tablet Take 81 mg by mouth daily after supper.   atorvastatin (LIPITOR) 80 MG tablet TAKE ONE  TABLET BY MOUTH ONE TIME DAILY   clopidogrel (PLAVIX) 75 MG tablet Take 1 tablet (75 mg total) by mouth daily.   Coenzyme Q-10 100 MG capsule Take 2 capsules (200 mg total) by mouth daily.   COMBIGAN 0.2-0.5 % ophthalmic solution Place 1 drop into both eyes as directed.   dorzolamide-timolol (COSOPT) 2-0.5 % ophthalmic solution Place 1 drop into both eyes 2 (two) times daily.   irbesartan (AVAPRO) 150 MG tablet TAKE ONE TABLET BY MOUTH ONE TIME DAILY   latanoprost (XALATAN) 0.005 % ophthalmic solution Place 1 drop into both eyes at bedtime.   Timolol Maleate, Once-Daily, 0.5 % SOLN Place 1 drop into both eyes 2 (two) times daily     Allergies  Allergen Reactions   Imdur [Isosorbide Nitrate] Other (See Comments)    Headache   Lisinopril Cough    Social History   Socioeconomic History   Marital status: Married    Spouse name: Not on file   Number of children: Not on file   Years of education: Not on file   Highest education level: Not on file  Occupational History   Not on file  Tobacco Use   Smoking status: Never   Smokeless tobacco: Never  Substance and Sexual Activity   Alcohol use: No    Alcohol/week: 0.0 standard drinks of alcohol   Drug use: No   Sexual activity: Yes  Other Topics Concern   Not on file  Social History Narrative   Not on  file   Social Determinants of Health   Financial Resource Strain: Not on file  Food Insecurity: Not on file  Transportation Needs: Not on file  Physical Activity: Not on file  Stress: Not on file  Social Connections: Not on file  Intimate Partner Violence: Not on file     Review of Systems: General: negative for chills, fever, night sweats or weight changes.  Cardiovascular: negative for chest pain, dyspnea on exertion, edema, orthopnea, palpitations, paroxysmal nocturnal dyspnea or shortness of breath Dermatological: negative for rash Respiratory: negative for cough or wheezing Urologic: negative for hematuria Abdominal:  negative for nausea, vomiting, diarrhea, bright red blood per rectum, melena, or hematemesis Neurologic: negative for visual changes, syncope, or dizziness All other systems reviewed and are otherwise negative except as noted above.    Blood pressure 122/80, pulse (!) 57, height 5\' 10"  (1.778 m), weight 209 lb 12.8 oz (95.2 kg), SpO2 97%.  General appearance: alert and no distress Neck: no adenopathy, no carotid bruit, no JVD, supple, symmetrical, trachea midline, and thyroid not enlarged, symmetric, no tenderness/mass/nodules Lungs: clear to auscultation bilaterally Heart: Regular rate and rhythm without murmurs gallops rubs or clicks Extremities: extremities normal, atraumatic, no cyanosis or edema Pulses: 2+ and symmetric Skin: Skin color, texture, turgor normal. No rashes or lesions Neurologic: Grossly normal  EKG EKG Interpretation Date/Time:  Wednesday August 16 2023 09:48:45 EDT Ventricular Rate:  57 PR Interval:  146 QRS Duration:  92 QT Interval:  398 QTC Calculation: 387 R Axis:   -34  Text Interpretation: Sinus bradycardia Left axis deviation When compared with ECG of 04-Jun-2015 06:42, QRS axis Shifted left Confirmed by Nanetta Batty [phone]) on 08/16/2023 10:09:16 AM    ASSESSMENT AND PLAN:   Hyperlipidemia LDL goal <70 The patient history of hyperlipidemia on statin therapy with lipid profile performed 09/02/2022 revealing a total cholesterol of 112, LDL 57 and HDL 25.  Essential hypertension History of essential hypertension with blood pressure measured today at 122/80.  He is on amlodipine, and Avapro.  CAD, multiple vessel, culprit RCA -prox with DES placed, total LCX-old, mod LAD- FFR not Hemodynamically signficant 06/06/14  History of CAD status post dominant RCA stenting by Dr. Eldridge Dace 06/06/2014 with a 3.5 mm x 18 mm long drug-eluting stent.  He had an occluded circumflex with right to left collaterals and moderate mid LAD disease.  Because of recurrent  symptoms and a Myoview that showed inferolateral ischemia I performed cardiac catheterization on him 03/30/2015 revealing unchanged anatomy.  I thought that his circumflex CTO may have been responsible for his symptoms and he underwent complex CTO intervention by Dr. Eldridge Dace 06/03/2015 using both antegrade and retrograde approach performing AV groove circumflex and marginal bifurcation stenting.  His symptoms resolved after that.  He denies chest pain or shortness of breath.     Runell Gess MD FACP,FACC,FAHA, [facility] 08/16/2023 10:15 AM

## 2023-08-16 NOTE — Patient Instructions (Signed)

## 2023-08-16 NOTE — Assessment & Plan Note (Signed)
The patient history of hyperlipidemia on statin therapy with lipid profile performed 09/02/2022 revealing a total cholesterol of 112, LDL 57 and HDL 25.

## 2023-08-16 NOTE — Assessment & Plan Note (Signed)
History of CAD status post dominant RCA stenting by Dr. Eldridge Dace 06/06/2014 with a 3.5 mm x 18 mm long drug-eluting stent.  He had an occluded circumflex with right to left collaterals and moderate mid LAD disease.  Because of recurrent symptoms and a Myoview that showed inferolateral ischemia I performed cardiac catheterization on him 03/30/2015 revealing unchanged anatomy.  I thought that his circumflex CTO may have been responsible for his symptoms and he underwent complex CTO intervention by Dr. Eldridge Dace 06/03/2015 using both antegrade and retrograde approach performing AV groove circumflex and marginal bifurcation stenting.  His symptoms resolved after that.  He denies chest pain or shortness of breath.

## 2023-08-18 ENCOUNTER — Other Ambulatory Visit (HOSPITAL_COMMUNITY): Payer: Self-pay

## 2023-08-28 ENCOUNTER — Other Ambulatory Visit: Payer: Self-pay | Admitting: *Deleted

## 2023-08-28 MED ORDER — CLOPIDOGREL BISULFATE 75 MG PO TABS: 75.0000 mg | ORAL_TABLET | Freq: Every day | ORAL | 3 refills | Status: DC

## 2023-09-14 ENCOUNTER — Telehealth: Payer: Self-pay | Admitting: Cardiovascular Disease

## 2023-09-14 MED ORDER — AMLODIPINE BESYLATE 5 MG PO TABS: 5.0000 mg | ORAL_TABLET | Freq: Every day | ORAL | 3 refills | Status: DC

## 2023-09-14 NOTE — Telephone Encounter (Signed)
*  STAT* If patient is at the pharmacy, call can be transferred to refill team.   1. Which medications need to be refilled? (please list name of each medication and dose if known)   amLODipine (NORVASC) 5 MG tablet    2. Which pharmacy/location (including street and city if local pharmacy) is medication to be sent to?[facility] [address] [place], [place] - [address] AT [facility] [address]   3. Do they need a 30 day or 90 day supply? 90 day

## 2023-09-14 NOTE — Telephone Encounter (Signed)
Pt's medication was sent to pt's pharmacy as requested. Confirmation received.  °

## 2023-09-25 ENCOUNTER — Other Ambulatory Visit (HOSPITAL_COMMUNITY): Payer: Self-pay

## 2023-09-28 ENCOUNTER — Other Ambulatory Visit (HOSPITAL_COMMUNITY): Payer: Self-pay

## 2023-09-28 MED ORDER — PREDNISOLONE ACETATE 1 % OP SUSP
1.0000 [drp] | Freq: Four times a day (QID) | OPHTHALMIC | 2 refills | Status: AC
  Filled 2023-09-28: qty 10, 25d supply, fill #0
  Filled 2023-10-30: qty 10, 25d supply, fill #1

## 2023-09-28 MED ORDER — ACETAZOLAMIDE 250 MG PO TABS
500.0000 mg | ORAL_TABLET | Freq: Two times a day (BID) | ORAL | 2 refills | Status: AC
  Filled 2023-09-28: qty 120, 30d supply, fill #0

## 2023-10-04 ENCOUNTER — Other Ambulatory Visit: Payer: Self-pay | Admitting: Cardiovascular Disease

## 2023-10-09 ENCOUNTER — Other Ambulatory Visit (HOSPITAL_COMMUNITY): Payer: Self-pay

## 2023-10-09 MED ORDER — RHOPRESSA 0.02 % OP SOLN
1.0000 [drp] | Freq: Every evening | OPHTHALMIC | 6 refills | Status: AC
  Filled 2023-10-09: qty 2.5, 30d supply, fill #0

## 2023-10-11 ENCOUNTER — Other Ambulatory Visit (HOSPITAL_COMMUNITY): Payer: Self-pay

## 2023-10-27 ENCOUNTER — Other Ambulatory Visit: Payer: Self-pay | Admitting: Cardiovascular Disease

## 2023-10-30 ENCOUNTER — Other Ambulatory Visit (HOSPITAL_COMMUNITY): Payer: Self-pay

## 2023-10-31 ENCOUNTER — Other Ambulatory Visit (HOSPITAL_COMMUNITY): Payer: Self-pay

## 2023-10-31 MED ORDER — LATANOPROST 0.005 % OP SOLN
1.0000 [drp] | Freq: Every evening | OPHTHALMIC | 4 refills | Status: DC
  Filled 2023-10-31: qty 10, 90d supply, fill #0
  Filled 2023-11-30: qty 2.5, 30d supply, fill #0
  Filled 2023-12-29: qty 2.5, 30d supply, fill #1
  Filled 2024-02-19: qty 2.5, 30d supply, fill #2

## 2023-10-31 MED ORDER — LATANOPROST 0.005 % OP SOLN
OPHTHALMIC | 3 refills | Status: DC
  Filled 2023-10-31: qty 2.5, 25d supply, fill #0

## 2023-11-13 ENCOUNTER — Other Ambulatory Visit (HOSPITAL_COMMUNITY): Payer: Self-pay

## 2023-11-30 ENCOUNTER — Other Ambulatory Visit (HOSPITAL_COMMUNITY): Payer: Self-pay

## 2023-12-29 ENCOUNTER — Other Ambulatory Visit (HOSPITAL_COMMUNITY): Payer: Self-pay

## 2024-01-01 ENCOUNTER — Other Ambulatory Visit (HOSPITAL_COMMUNITY): Payer: Self-pay

## 2024-01-29 ENCOUNTER — Other Ambulatory Visit (HOSPITAL_COMMUNITY): Payer: Self-pay

## 2024-01-29 ENCOUNTER — Other Ambulatory Visit: Payer: Self-pay

## 2024-01-29 MED ORDER — DORZOLAMIDE HCL-TIMOLOL MAL 2-0.5 % OP SOLN
OPHTHALMIC | 3 refills | Status: DC
  Filled 2024-01-29: qty 10, 30d supply, fill #0
  Filled 2024-03-14 – 2024-03-15 (×2): qty 10, 30d supply, fill #1
  Filled 2024-04-18: qty 10, 30d supply, fill #2
  Filled 2024-06-10 – 2024-06-13 (×2): qty 10, 30d supply, fill #3

## 2024-01-29 MED ORDER — ACETAZOLAMIDE 250 MG PO TABS
500.0000 mg | ORAL_TABLET | Freq: Two times a day (BID) | ORAL | 3 refills | Status: DC
  Filled 2024-01-29: qty 120, 30d supply, fill #0
  Filled 2024-03-01: qty 120, 30d supply, fill #1

## 2024-01-29 MED ORDER — LATANOPROST 0.005 % OP SOLN
OPHTHALMIC | 3 refills | Status: DC
  Filled 2024-01-29: qty 2.5, 28d supply, fill #0
  Filled 2024-03-14: qty 2.5, 25d supply, fill #1
  Filled 2024-04-22: qty 2.5, 30d supply, fill #2

## 2024-01-30 ENCOUNTER — Other Ambulatory Visit (HOSPITAL_COMMUNITY): Payer: Self-pay

## 2024-02-01 ENCOUNTER — Other Ambulatory Visit (HOSPITAL_COMMUNITY): Payer: Self-pay

## 2024-02-01 MED ORDER — RHOPRESSA 0.02 % OP SOLN
1.0000 [drp] | Freq: Every evening | OPHTHALMIC | 6 refills | Status: DC
  Filled 2024-02-01: qty 2.5, 30d supply, fill #0

## 2024-02-12 ENCOUNTER — Other Ambulatory Visit (HOSPITAL_COMMUNITY): Payer: Self-pay

## 2024-02-19 ENCOUNTER — Other Ambulatory Visit (HOSPITAL_COMMUNITY): Payer: Self-pay

## 2024-02-20 ENCOUNTER — Other Ambulatory Visit (HOSPITAL_COMMUNITY): Payer: Self-pay

## 2024-03-14 ENCOUNTER — Other Ambulatory Visit (HOSPITAL_COMMUNITY): Payer: Self-pay

## 2024-03-15 ENCOUNTER — Other Ambulatory Visit (HOSPITAL_COMMUNITY): Payer: Self-pay

## 2024-03-27 ENCOUNTER — Other Ambulatory Visit (HOSPITAL_COMMUNITY): Payer: Self-pay

## 2024-03-27 MED ORDER — PREDNISOLONE ACETATE 1 % OP SUSP
1.0000 [drp] | Freq: Four times a day (QID) | OPHTHALMIC | 2 refills | Status: DC
  Filled 2024-03-27: qty 10, 30d supply, fill #0

## 2024-03-28 ENCOUNTER — Other Ambulatory Visit (HOSPITAL_COMMUNITY): Payer: Self-pay

## 2024-04-18 ENCOUNTER — Other Ambulatory Visit (HOSPITAL_COMMUNITY): Payer: Self-pay

## 2024-04-19 ENCOUNTER — Other Ambulatory Visit (HOSPITAL_COMMUNITY): Payer: Self-pay

## 2024-04-22 ENCOUNTER — Other Ambulatory Visit (HOSPITAL_COMMUNITY): Payer: Self-pay

## 2024-05-07 ENCOUNTER — Other Ambulatory Visit (HOSPITAL_COMMUNITY): Payer: Self-pay

## 2024-05-07 MED ORDER — LATANOPROST 0.005 % OP SOLN
1.0000 [drp] | Freq: Every evening | OPHTHALMIC | 3 refills | Status: DC
  Filled 2024-05-07: qty 7.5, 150d supply, fill #0
  Filled 2024-05-16: qty 2.5, 30d supply, fill #0
  Filled 2024-06-10 – 2024-06-13 (×2): qty 2.5, 30d supply, fill #1
  Filled 2024-07-11: qty 2.5, 30d supply, fill #2
  Filled 2024-08-09 (×2): qty 2.5, 30d supply, fill #3
  Filled 2024-08-09: qty 2.5, 50d supply, fill #3

## 2024-05-16 ENCOUNTER — Other Ambulatory Visit (HOSPITAL_COMMUNITY): Payer: Self-pay

## 2024-06-10 ENCOUNTER — Other Ambulatory Visit (HOSPITAL_COMMUNITY): Payer: Self-pay

## 2024-06-13 ENCOUNTER — Other Ambulatory Visit (HOSPITAL_COMMUNITY): Payer: Self-pay

## 2024-07-02 ENCOUNTER — Other Ambulatory Visit: Payer: Self-pay

## 2024-07-02 MED ORDER — CLOPIDOGREL BISULFATE 75 MG PO TABS: 75.0000 mg | ORAL_TABLET | Freq: Every day | ORAL | 0 refills | Status: DC

## 2024-07-11 ENCOUNTER — Other Ambulatory Visit (HOSPITAL_COMMUNITY): Payer: Self-pay

## 2024-07-12 ENCOUNTER — Other Ambulatory Visit (HOSPITAL_COMMUNITY): Payer: Self-pay

## 2024-07-12 MED ORDER — DORZOLAMIDE HCL-TIMOLOL MAL 2-0.5 % OP SOLN
OPHTHALMIC | 3 refills | Status: DC
  Filled 2024-07-12: qty 10, 30d supply, fill #0
  Filled 2024-08-09 (×3): qty 10, 30d supply, fill #1

## 2024-07-22 ENCOUNTER — Other Ambulatory Visit: Payer: Self-pay | Admitting: Cardiovascular Disease

## 2024-08-09 ENCOUNTER — Other Ambulatory Visit (HOSPITAL_COMMUNITY): Payer: Self-pay

## 2024-08-12 ENCOUNTER — Encounter: Payer: Self-pay | Admitting: Cardiovascular Disease

## 2024-08-12 ENCOUNTER — Ambulatory Visit: Attending: Cardiovascular Disease | Admitting: Cardiovascular Disease

## 2024-08-12 VITALS — BP 112/70 | HR 52 | Ht 70.0 in | Wt 205.9 lb

## 2024-08-12 DIAGNOSIS — E785 Hyperlipidemia, unspecified: Secondary | ICD-10-CM

## 2024-08-12 DIAGNOSIS — I2510 Atherosclerotic heart disease of native coronary artery without angina pectoris: Secondary | ICD-10-CM

## 2024-08-12 DIAGNOSIS — I10 Essential (primary) hypertension: Secondary | ICD-10-CM

## 2024-08-12 NOTE — Assessment & Plan Note (Signed)
 History of hyperlipidemia on high-dose statin therapy with lipid profile performed 09/02/2022 revealing a total cholesterol 112, LDL 57 and HDL of 25.  He is at goal for secondary prevention.  I am going to repeat a lipid and liver profile.

## 2024-08-12 NOTE — Assessment & Plan Note (Signed)
 History of essential hypertension with blood pressure measured today 112/70.  He is on amlodipine  and Avapro .

## 2024-08-12 NOTE — Progress Notes (Signed)
 08/12/2024 Tyler Holder Tyler Holder   May 13, 1961  [id number]  Primary Physician Patient, No Pcp Per Primary Cardiologist: Dorn JINNY Lesches MD GENI CODY LYNITA ILAH  HPI:  Tyler Holder is a 63 y.o.    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 He was referred by his primary care physician for evaluation of chest pain. I last saw him in the office 08/16/2023.SABRAHe is accompanied by his wife Tyler Holder today.  He has  had intervention by Dr. Dann 06/06/2014 on a dominant RCA using a drug-eluting stent (3.5 mm x 18 mm). 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 His cardiac risk factors include hypertension and family history (father died of myocardial infarction in his early 56s). Because of recurrent symptoms and a Myoview that showed inferolateral ischemia I performed chronic catheterization on him 03/30/15 revealing unchanged anatomy. I thought it was possible that his circumflex chronic total occlusion was continuing to his chest pain as well as his Myoview abnormality and therefore referred him back to Dr. Dann who performed a complex CTO intervention 06/03/15 using both antegrade and retrograde approach. He performed circumflex AV groove and obtuse marginal branch bifurcation stenting. His symptoms have since resolved.    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     Since I saw him a year ago he remains asymptomatic.  Denies chest pain or shortness of breath.     Current Meds  Medication Sig   acetaminophen  (TYLENOL ) 500 MG tablet Take 500 mg by mouth every 6 (six) hours as needed (pain).   acetaZOLAMIDE  (DIAMOX ) 250 MG tablet Take 2 tablets (500 mg total) by mouth 2 (two) times daily.   amLODipine  (NORVASC ) 5 MG tablet Take 1 tablet (5 mg total) by mouth  daily.   aspirin  EC 81 MG tablet Take 81 mg by mouth daily after supper.   atorvastatin  (LIPITOR ) 80 MG tablet TAKE ONE TABLET BY MOUTH ONE TIME DAILY   clopidogrel  (PLAVIX ) 75 MG tablet Take 1 tablet (75 mg total) by mouth daily.   Coenzyme Q-10 100 MG capsule Take 2 capsules (200 mg total) by mouth daily.   COMBIGAN 0.2-0.5 % ophthalmic solution Place 1 drop into both eyes as directed.   dorzolamide -timolol  (COSOPT ) 2-0.5 % ophthalmic solution Place 1 drop into both eyes 2 (two) times daily.   irbesartan  (AVAPRO ) 150 MG tablet TAKE ONE TABLET BY MOUTH ONE TIME DAILY   latanoprost  (XALATAN ) 0.005 % ophthalmic solution Place 1 drop into both eyes at bedtime.   Netarsudil  Dimesylate (RHOPRESSA ) 0.02 % SOLN Place 1 drop into the left eye every evening.   nitroGLYCERIN  (NITROSTAT ) 0.4 MG SL tablet Place 1 tablet (0.4 mg total) under the tongue every 5 (five) minutes x 3 doses as needed for chest pain.   prednisoLONE  acetate (PRED FORTE ) 1 % ophthalmic suspension Place 1 drop into both eyes 4 (four) times daily.   Timolol  Maleate, Once-Daily, 0.5 % SOLN Place 1 drop into both eyes 2 (two) times daily     Allergies  Allergen Reactions   Imdur  [Isosorbide  Nitrate] Other (See Comments)    Headache   Lisinopril  Cough    Social History   Socioeconomic History   Marital status: Married    Spouse name: Not on  file   Number of children: Not on file   Years of education: Not on file   Highest education level: Not on file  Occupational History   Not on file  Tobacco Use   Smoking status: Never   Smokeless tobacco: Never  Substance and Sexual Activity   Alcohol use: No    Alcohol/week: 0.0 standard drinks of alcohol   Drug use: No   Sexual activity: Yes  Other Topics Concern   Not on file  Social History Narrative   Not on file   Social Drivers of Health   Financial Resource Strain: Not on file  Food Insecurity: Not on file  Transportation Needs: Not on file  Physical Activity: Not  on file  Stress: Not on file  Social Connections: Not on file  Intimate Partner Violence: Not on file     Review of Systems: General: negative for chills, fever, night sweats or weight changes.  Cardiovascular: negative for chest pain, dyspnea on exertion, edema, orthopnea, palpitations, paroxysmal nocturnal dyspnea or shortness of breath Dermatological: negative for rash Respiratory: negative for cough or wheezing Urologic: negative for hematuria Abdominal: negative for nausea, vomiting, diarrhea, bright red blood per rectum, melena, or hematemesis Neurologic: negative for visual changes, syncope, or dizziness All other systems reviewed and are otherwise negative except as noted above.    Blood pressure 112/70, pulse (!) 52, height 5' 10 (1.778 m), weight 205 lb 14.4 oz (93.4 kg), SpO2 96%.  General appearance: alert and no distress Neck: no adenopathy, no carotid bruit, no JVD, supple, symmetrical, trachea midline, and thyroid not enlarged, symmetric, no tenderness/mass/nodules Lungs: clear to auscultation bilaterally Heart: regular rate and rhythm, S1, S2 normal, no murmur, click, rub or gallop Extremities: extremities normal, atraumatic, no cyanosis or edema Pulses: 2+ and symmetric Skin: Skin color, texture, turgor normal. No rashes or lesions Neurologic: Grossly normal  EKG EKG Interpretation Date/Time:  Monday August 12 2024 15:14:46 EDT Ventricular Rate:  52 PR Interval:  134 QRS Duration:  94 QT Interval:  432 QTC Calculation: 401 R Axis:   -30  Text Interpretation: Sinus bradycardia Left axis deviation When compared with ECG of 16-Aug-2023 09:48, No significant change was found Confirmed by Court Carrier [phone]) on 08/12/2024 3:28:55 PM    ASSESSMENT AND PLAN:   Hyperlipidemia LDL goal <70 History of hyperlipidemia on high-dose statin therapy with lipid profile performed 09/02/2022 revealing a total cholesterol 112, LDL 57 and HDL of 25.  He is at goal for  secondary prevention.  I am going to repeat a lipid and liver profile.  Essential hypertension History of essential hypertension with blood pressure measured today 112/70.  He is on amlodipine  and Avapro .  CAD, multiple vessel, culprit RCA -prox with DES placed, total LCX-old, mod LAD- FFR not Hemodynamically signficant 06/06/14  History of CAD status post RCA intervention by Dr. Dann using a 3.5 mm x 18 mm long drug-eluting stent 06/06/2024.  He did have an occluded circumflex at that time with left-to-right collaterals and moderate mid LAD disease.  Because of an abnormal Myoview I wreak catheterized him/11/16 revealing unchanged anatomy.  I ultimately sent him back to Dr. Dann for circumflex CTO intervention which was successful resulting in resolution of his symptoms.  He has remained asymptomatic since on dual antiplatelet therapy.     Carrier DOROTHA Court MD FACP,FACC,FAHA, [facility] 08/12/2024 3:35 PM

## 2024-08-12 NOTE — Assessment & Plan Note (Signed)
 History of CAD status post RCA intervention by Dr. Dann using a 3.5 mm x 18 mm long drug-eluting stent 06/06/2024.  He did have an occluded circumflex at that time with left-to-right collaterals and moderate mid LAD disease.  Because of an abnormal Myoview I wreak catheterized him/11/16 revealing unchanged anatomy.  I ultimately sent him back to Dr. Dann for circumflex CTO intervention which was successful resulting in resolution of his symptoms.  He has remained asymptomatic since on dual antiplatelet therapy.

## 2024-08-12 NOTE — Patient Instructions (Signed)
 Medication Instructions:  Your physician recommends that you continue on your current medications as directed. Please refer to the Current Medication list given to you today.  *If you need a refill on your cardiac medications before your next appointment, please call your pharmacy*  Lab Work: Your physician recommends that you return for lab work in: the next week or 2 for FASTING Lipid/liver panel   If you have labs (blood work) drawn today and your tests are completely normal, you will receive your results only by: MyChart Message (if you have MyChart) OR A paper copy in the mail If you have any lab test that is abnormal or we need to change your treatment, we will call you to review the results.   Follow-Up: At [facility], you and your health needs are our priority.  As part of our continuing mission to provide you with exceptional heart care, our providers are all part of one team.  This team includes your primary Cardiologist (physician) and Advanced Practice Providers or APPs (Physician Assistants and Nurse Practitioners) who all work together to provide you with the care you need, when you need it.  Your next appointment:   12 month(s)  Provider:   Lauro Portal, MD   We recommend signing up for the patient portal called "MyChart".  Sign up information is provided on this After Visit Summary.  MyChart is used to connect with patients for Virtual Visits (Telemedicine).  Patients are able to view lab/test results, encounter notes, upcoming appointments, etc.  Non-urgent messages can be sent to your provider as well.   To learn more about what you can do with MyChart, go to [email or web address].

## 2024-08-20 ENCOUNTER — Other Ambulatory Visit: Payer: Self-pay

## 2024-08-22 MED ORDER — AMLODIPINE BESYLATE 5 MG PO TABS: 5.0000 mg | ORAL_TABLET | Freq: Every day | ORAL | 3 refills | Status: AC

## 2024-09-02 ENCOUNTER — Other Ambulatory Visit (HOSPITAL_COMMUNITY): Payer: Self-pay

## 2024-09-02 MED ORDER — LATANOPROST 0.005 % OP SOLN
1.0000 [drp] | Freq: Every evening | OPHTHALMIC | 3 refills | Status: AC
Start: 2024-09-02 — End: ?
  Filled 2024-09-02: qty 2.5, 30d supply, fill #0
  Filled 2024-09-30: qty 2.5, 30d supply, fill #1
  Filled 2024-10-31 (×2): qty 2.5, 30d supply, fill #2
  Filled 2024-12-02: qty 2.5, 30d supply, fill #3
  Filled 2024-12-31: qty 2.5, 30d supply, fill #4

## 2024-09-02 MED ORDER — DORZOLAMIDE HCL-TIMOLOL MAL 2-0.5 % OP SOLN
OPHTHALMIC | 3 refills | Status: DC
  Filled 2024-09-02 – 2024-09-03 (×3): qty 10, 30d supply, fill #0
  Filled 2024-09-30 – 2024-10-03 (×3): qty 10, 30d supply, fill #1
  Filled 2024-10-31 – 2024-11-02 (×4): qty 10, 30d supply, fill #2
  Filled 2024-12-02: qty 10, 30d supply, fill #3
  Filled ????-??-??: fill #2

## 2024-09-03 ENCOUNTER — Other Ambulatory Visit (HOSPITAL_COMMUNITY): Payer: Self-pay

## 2024-09-03 ENCOUNTER — Other Ambulatory Visit: Payer: Self-pay

## 2024-09-04 ENCOUNTER — Ambulatory Visit: Payer: Self-pay | Admitting: Cardiovascular Disease

## 2024-09-04 LAB — HEPATIC FUNCTION PANEL
ALT: 39 IU/L (ref 0–44)
AST: 20 IU/L (ref 0–40)
Albumin: 4.1 g/dL (ref 3.9–4.9)
Alkaline Phosphatase: 78 IU/L (ref 47–123)
Bilirubin Total: 0.6 mg/dL (ref 0.0–1.2)
Bilirubin, Direct: 0.18 mg/dL (ref 0.00–0.40)
Total Protein: 6.2 g/dL (ref 6.0–8.5)

## 2024-09-04 LAB — LIPID PANEL
Chol/HDL Ratio: 4.3 ratio (ref 0.0–5.0)
Cholesterol, Total: 113 mg/dL (ref 100–199)
HDL: 26 mg/dL — ABNORMAL LOW (ref >39)
LDL Chol Calc (NIH): 51 mg/dL (ref 0–99)
Triglycerides: 222 mg/dL — ABNORMAL HIGH (ref 0–149)
VLDL Cholesterol Cal: 36 mg/dL (ref 5–40)

## 2024-09-08 ENCOUNTER — Other Ambulatory Visit: Payer: Self-pay | Admitting: Cardiovascular Disease

## 2024-09-30 ENCOUNTER — Other Ambulatory Visit (HOSPITAL_COMMUNITY): Payer: Self-pay

## 2024-10-02 ENCOUNTER — Other Ambulatory Visit (HOSPITAL_BASED_OUTPATIENT_CLINIC_OR_DEPARTMENT_OTHER): Payer: Self-pay

## 2024-10-31 ENCOUNTER — Other Ambulatory Visit (HOSPITAL_COMMUNITY): Payer: Self-pay

## 2024-12-02 ENCOUNTER — Other Ambulatory Visit: Payer: Self-pay

## 2024-12-02 ENCOUNTER — Other Ambulatory Visit (HOSPITAL_COMMUNITY): Payer: Self-pay

## 2024-12-03 MED ORDER — CLOPIDOGREL BISULFATE 75 MG PO TABS: 75.0000 mg | ORAL_TABLET | Freq: Every day | ORAL | 3 refills | Status: AC

## 2024-12-31 ENCOUNTER — Other Ambulatory Visit (HOSPITAL_COMMUNITY): Payer: Self-pay

## 2025-01-01 ENCOUNTER — Other Ambulatory Visit: Payer: Self-pay

## 2025-01-02 ENCOUNTER — Other Ambulatory Visit (HOSPITAL_COMMUNITY): Payer: Self-pay

## 2025-01-02 MED ORDER — DORZOLAMIDE HCL-TIMOLOL MAL 2-0.5 % OP SOLN
OPHTHALMIC | 3 refills | Status: AC
  Filled 2025-01-02: qty 10, 30d supply, fill #0

## 2025-01-03 ENCOUNTER — Other Ambulatory Visit (HOSPITAL_COMMUNITY): Payer: Self-pay

## 2025-01-06 ENCOUNTER — Other Ambulatory Visit (HOSPITAL_COMMUNITY): Payer: Self-pay

## 2025-01-06 MED ORDER — PREDNISOLONE ACETATE 1 % OP SUSP
1.0000 [drp] | Freq: Four times a day (QID) | OPHTHALMIC | 1 refills | Status: AC
  Filled 2025-01-06: qty 5, 25d supply, fill #0
  Filled 2025-01-06: qty 10, 30d supply, fill #0
